# Patient Record
Sex: Female | Born: 1985 | Race: White | Hispanic: No | Marital: Married | Smoking: Current every day smoker
Health system: Southern US, Community
[De-identification: ages and names within clinical notes are randomized; demographics above are authoritative.]

## PROBLEM LIST (undated history)

## (undated) DIAGNOSIS — F319 Bipolar disorder, unspecified: Secondary | ICD-10-CM

## (undated) DIAGNOSIS — F1910 Other psychoactive substance abuse, uncomplicated: Secondary | ICD-10-CM

## (undated) DIAGNOSIS — Z87440 Personal history of urinary (tract) infections: Secondary | ICD-10-CM

## (undated) DIAGNOSIS — F32A Depression, unspecified: Secondary | ICD-10-CM

## (undated) DIAGNOSIS — D649 Anemia, unspecified: Secondary | ICD-10-CM

## (undated) DIAGNOSIS — F329 Major depressive disorder, single episode, unspecified: Secondary | ICD-10-CM

## (undated) DIAGNOSIS — F53 Postpartum depression: Secondary | ICD-10-CM

## (undated) DIAGNOSIS — Z87898 Personal history of other specified conditions: Secondary | ICD-10-CM

## (undated) DIAGNOSIS — Z8751 Personal history of pre-term labor: Secondary | ICD-10-CM

## (undated) DIAGNOSIS — F17200 Nicotine dependence, unspecified, uncomplicated: Secondary | ICD-10-CM

## (undated) DIAGNOSIS — N809 Endometriosis, unspecified: Secondary | ICD-10-CM

## (undated) DIAGNOSIS — Z8759 Personal history of other complications of pregnancy, childbirth and the puerperium: Secondary | ICD-10-CM

## (undated) DIAGNOSIS — N83209 Unspecified ovarian cyst, unspecified side: Secondary | ICD-10-CM

## (undated) DIAGNOSIS — O99345 Other mental disorders complicating the puerperium: Secondary | ICD-10-CM

## (undated) DIAGNOSIS — R011 Cardiac murmur, unspecified: Secondary | ICD-10-CM

## (undated) HISTORY — DX: Personal history of pre-term labor: Z87.51

## (undated) HISTORY — DX: Personal history of urinary (tract) infections: Z87.440

## (undated) HISTORY — DX: Endometriosis, unspecified: N80.9

## (undated) HISTORY — PX: NO PAST SURGERIES: SHX2092

## (undated) HISTORY — DX: Personal history of other specified conditions: Z87.898

## (undated) HISTORY — DX: Bipolar disorder, unspecified: F31.9

## (undated) HISTORY — DX: Postpartum depression: F53.0

## (undated) HISTORY — DX: Other psychoactive substance abuse, uncomplicated: F19.10

## (undated) HISTORY — DX: Personal history of other complications of pregnancy, childbirth and the puerperium: Z87.59

## (undated) HISTORY — DX: Anemia, unspecified: D64.9

## (undated) HISTORY — PX: OTHER SURGICAL HISTORY: SHX169

## (undated) HISTORY — DX: Other mental disorders complicating the puerperium: O99.345

---
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
## 2007-09-02 ENCOUNTER — Other Ambulatory Visit: Admission: RE | Admit: 2007-09-02 | Discharge: 2007-09-02 | Payer: Self-pay | Admitting: Obstetrics & Gynecology

## 2007-10-06 ENCOUNTER — Emergency Department: Payer: Self-pay | Admitting: Emergency Medicine

## 2008-02-11 ENCOUNTER — Observation Stay (HOSPITAL_COMMUNITY): Admission: AD | Admit: 2008-02-11 | Discharge: 2008-02-11 | Payer: Self-pay | Admitting: Family Medicine

## 2008-02-11 ENCOUNTER — Ambulatory Visit: Payer: Self-pay | Admitting: Family Medicine

## 2008-02-25 ENCOUNTER — Ambulatory Visit: Payer: Self-pay | Admitting: Family Medicine

## 2008-02-25 ENCOUNTER — Inpatient Hospital Stay (HOSPITAL_COMMUNITY): Admission: AD | Admit: 2008-02-25 | Discharge: 2008-02-25 | Payer: Self-pay | Admitting: Family Medicine

## 2008-02-25 ENCOUNTER — Inpatient Hospital Stay (HOSPITAL_COMMUNITY): Admission: AD | Admit: 2008-02-25 | Discharge: 2008-02-28 | Payer: Self-pay | Admitting: Obstetrics & Gynecology

## 2010-12-15 LAB — CBC
HCT: 32.1 % — ABNORMAL LOW (ref 36.0–46.0)
HCT: 32.2 % — ABNORMAL LOW (ref 36.0–46.0)
Hemoglobin: 11.7 g/dL — ABNORMAL LOW (ref 12.0–15.0)
MCHC: 33.7 g/dL (ref 30.0–36.0)
MCHC: 34.2 g/dL (ref 30.0–36.0)
MCV: 84.6 fL (ref 78.0–100.0)
MCV: 84.7 fL (ref 78.0–100.0)
MCV: 85.7 fL (ref 78.0–100.0)
Platelets: 189 10*3/uL (ref 150–400)
Platelets: 207 10*3/uL (ref 150–400)
RBC: 4.12 MIL/uL (ref 3.87–5.11)
RDW: 13.3 % (ref 11.5–15.5)
RDW: 13.5 % (ref 11.5–15.5)
RDW: 14.2 % (ref 11.5–15.5)
WBC: 15.1 10*3/uL — ABNORMAL HIGH (ref 4.0–10.5)

## 2010-12-15 LAB — STREP B DNA PROBE: Strep Group B Ag: NEGATIVE

## 2010-12-15 LAB — RPR: RPR Ser Ql: NONREACTIVE

## 2011-10-02 ENCOUNTER — Encounter (HOSPITAL_COMMUNITY): Payer: Self-pay | Admitting: *Deleted

## 2011-10-02 ENCOUNTER — Emergency Department (HOSPITAL_COMMUNITY)
Admission: EM | Admit: 2011-10-02 | Discharge: 2011-10-03 | Disposition: A | Discharge status: Home / Self Care | Payer: Self-pay | Attending: Emergency Medicine | Admitting: Emergency Medicine

## 2011-10-02 DIAGNOSIS — R059 Cough, unspecified: Secondary | ICD-10-CM | POA: Insufficient documentation

## 2011-10-02 DIAGNOSIS — M778 Other enthesopathies, not elsewhere classified: Secondary | ICD-10-CM

## 2011-10-02 DIAGNOSIS — M65839 Other synovitis and tenosynovitis, unspecified forearm: Secondary | ICD-10-CM | POA: Insufficient documentation

## 2011-10-02 DIAGNOSIS — R05 Cough: Secondary | ICD-10-CM | POA: Insufficient documentation

## 2011-10-02 DIAGNOSIS — M25539 Pain in unspecified wrist: Secondary | ICD-10-CM | POA: Insufficient documentation

## 2011-10-02 NOTE — ED Notes (Signed)
Wrist pain for over a week, no known injury

## 2011-10-03 MED ORDER — MELOXICAM 7.5 MG PO TABS: ORAL_TABLET | ORAL | Status: DC

## 2011-10-03 MED ORDER — KETOROLAC TROMETHAMINE 10 MG PO TABS
10.0000 mg | ORAL_TABLET | Freq: Once | ORAL | Status: AC
  Administered 2011-10-03: 10 mg via ORAL
  Filled 2011-10-03: qty 1

## 2011-10-03 MED ORDER — HYDROCODONE-ACETAMINOPHEN 5-325 MG PO TABS: 1.0000 | ORAL_TABLET | ORAL | Status: AC | PRN

## 2011-10-03 MED ORDER — HYDROCODONE-ACETAMINOPHEN 5-325 MG PO TABS: 1.0000 | ORAL_TABLET | ORAL | Status: DC | PRN

## 2011-10-03 NOTE — ED Provider Notes (Signed)
Medical screening examination/treatment/procedure(s) were performed by non-physician practitioner and as supervising physician I was immediately available for consultation/collaboration.    Vida Roller, MD 10/03/11 [phone]

## 2011-10-03 NOTE — ED Provider Notes (Signed)
History     CSN: [id number]  Arrival date & time 10/02/11  2209   First MD Initiated Contact with Patient 10/02/11 2350      Chief Complaint  Patient presents with  . Wrist Pain    (Consider location/radiation/quality/duration/timing/severity/associated sxs/prior treatment) HPI Comments: Patient states she has had approximately 2-3 weeks of pain in the left wrist. Over the last 2-3 days the pain has been moving from the wrist up to the left shoulder. The pain is worse with movement. Patient has not had any known injury. The patient does a lot of lifting at her job. She also does a lot of lifting at work with her hands at home when cleaning up. The patient has not had any previous surgery or procedures on the left arm. She presents today because she heard a" pop in the wrist area and wanted it evaluated.  Patient is a 26 y.o. female presenting with wrist pain. The history is provided by the patient.  Wrist Pain Associated symptoms include arthralgias and coughing.    History reviewed. No pertinent past medical history.  No past surgical history on file.  No family history on file.  History  Substance Use Topics  . Smoking status: Current Everyday Smoker -- 1.0 packs/day  . Smokeless tobacco: Not on file  . Alcohol Use: No    OB History    Grav Para Term Preterm Abortions TAB SAB Ect Mult Living                  Review of Systems  Respiratory: Positive for cough.   Musculoskeletal: Positive for arthralgias.    Allergies  Benadryl; Clindamycin/lincomycin; and Codeine  Home Medications  No current outpatient prescriptions on file.  BP 123/63  Pulse 95  Temp 98.2 F (36.8 C) (Oral)  Resp 18  Ht 4\' 11"  (1.499 m)  Wt 94 lb 1 oz (42.666 kg)  BMI 19.00 kg/m2  SpO2 100%  Physical Exam  Nursing note and vitals reviewed. Constitutional: She is oriented to person, place, and time. She appears well-developed and well-nourished.  Non-toxic appearance.  HENT:  Head:  Normocephalic.  Right Ear: Tympanic membrane and external ear normal.  Left Ear: Tympanic membrane and external ear normal.  Eyes: EOM and lids are normal. Pupils are equal, round, and reactive to light.  Neck: Normal range of motion. Neck supple. Carotid bruit is not present.  Cardiovascular: Normal rate, regular rhythm, normal heart sounds, intact distal pulses and normal pulses.   Pulmonary/Chest: Breath sounds normal. No respiratory distress.  Abdominal: Soft. Bowel sounds are normal. There is no tenderness. There is no guarding.  Musculoskeletal: Normal range of motion.       There is soreness to pronation and supination of the left wrist with extension of the pain up to the elbow area. There no hot joints or hot areas on left. There is good capillary refill. Radial pulses and brachial pulses are 2+. This will range of motion of the left shoulder.  Lymphadenopathy:       Head (right side): No submandibular adenopathy present.       Head (left side): No submandibular adenopathy present.    She has no cervical adenopathy.  Neurological: She is alert and oriented to person, place, and time. She has normal strength. No cranial nerve deficit or sensory deficit.  Skin: Skin is warm and dry.  Psychiatric: She has a normal mood and affect. Her speech is normal.    ED Course  Procedures (  including critical care time)  Labs Reviewed - No data to display No results found.   No diagnosis found.    MDM   Patient noted on examination to have a tendinitis involving the left wrist. The patient was fitted with a left wrist splint.  Mobic 7.5 mg and Norco 5 mg ordered.       Kathie Dike, [place] 10/03/11 [phone]

## 2012-03-12 DIAGNOSIS — F17200 Nicotine dependence, unspecified, uncomplicated: Secondary | ICD-10-CM

## 2012-03-12 HISTORY — DX: Nicotine dependence, unspecified, uncomplicated: F17.200

## 2012-07-13 ENCOUNTER — Emergency Department (HOSPITAL_COMMUNITY)
Admission: EM | Admit: 2012-07-13 | Discharge: 2012-07-13 | Disposition: A | Discharge status: Home / Self Care | Payer: Medicaid Other | Attending: Emergency Medicine | Admitting: Emergency Medicine

## 2012-07-13 ENCOUNTER — Encounter (HOSPITAL_COMMUNITY): Payer: Self-pay | Admitting: Emergency Medicine

## 2012-07-13 ENCOUNTER — Emergency Department (HOSPITAL_COMMUNITY): Payer: Medicaid Other

## 2012-07-13 DIAGNOSIS — S8012XA Contusion of left lower leg, initial encounter: Secondary | ICD-10-CM

## 2012-07-13 DIAGNOSIS — F17200 Nicotine dependence, unspecified, uncomplicated: Secondary | ICD-10-CM | POA: Insufficient documentation

## 2012-07-13 DIAGNOSIS — S91309A Unspecified open wound, unspecified foot, initial encounter: Secondary | ICD-10-CM | POA: Insufficient documentation

## 2012-07-13 DIAGNOSIS — S8010XA Contusion of unspecified lower leg, initial encounter: Secondary | ICD-10-CM | POA: Insufficient documentation

## 2012-07-13 DIAGNOSIS — Y929 Unspecified place or not applicable: Secondary | ICD-10-CM | POA: Insufficient documentation

## 2012-07-13 DIAGNOSIS — Y939 Activity, unspecified: Secondary | ICD-10-CM | POA: Insufficient documentation

## 2012-07-13 DIAGNOSIS — W010XXA Fall on same level from slipping, tripping and stumbling without subsequent striking against object, initial encounter: Secondary | ICD-10-CM | POA: Insufficient documentation

## 2012-07-13 DIAGNOSIS — R011 Cardiac murmur, unspecified: Secondary | ICD-10-CM | POA: Insufficient documentation

## 2012-07-13 DIAGNOSIS — S91332A Puncture wound without foreign body, left foot, initial encounter: Secondary | ICD-10-CM

## 2012-07-13 HISTORY — DX: Cardiac murmur, unspecified: R01.1

## 2012-07-13 MED ORDER — HYDROCODONE-ACETAMINOPHEN 5-325 MG PO TABS
1.0000 | ORAL_TABLET | Freq: Once | ORAL | Status: AC
  Administered 2012-07-13: 1 via ORAL
  Filled 2012-07-13: qty 1

## 2012-07-13 MED ORDER — HYDROCODONE-ACETAMINOPHEN 5-325 MG PO TABS: ORAL_TABLET | ORAL | Status: DC

## 2012-07-13 MED ORDER — CIPROFLOXACIN HCL 500 MG PO TABS: 500.0000 mg | ORAL_TABLET | Freq: Two times a day (BID) | ORAL | Status: DC
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
## 2012-07-13 NOTE — ED Notes (Signed)
Discharge instructions given and reviewed with patient.  Prescriptions given; effects and use explained.  Patient verbalized understanding to complete all antibiotic and sedating effects of Vicodin.  Crutch fitting and teaching completed with return demonstration.  Patient discharged home in good condition.
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
## 2012-07-19 ENCOUNTER — Emergency Department (HOSPITAL_COMMUNITY)
Admission: EM | Admit: 2012-07-19 | Discharge: 2012-07-19 | Disposition: A | Discharge status: Home / Self Care | Payer: Medicaid Other | Attending: Emergency Medicine | Admitting: Emergency Medicine

## 2012-07-19 ENCOUNTER — Encounter (HOSPITAL_COMMUNITY): Payer: Self-pay | Admitting: *Deleted

## 2012-07-19 DIAGNOSIS — S93409A Sprain of unspecified ligament of unspecified ankle, initial encounter: Secondary | ICD-10-CM | POA: Insufficient documentation

## 2012-07-19 DIAGNOSIS — F17200 Nicotine dependence, unspecified, uncomplicated: Secondary | ICD-10-CM | POA: Insufficient documentation

## 2012-07-19 DIAGNOSIS — R011 Cardiac murmur, unspecified: Secondary | ICD-10-CM | POA: Insufficient documentation

## 2012-07-19 DIAGNOSIS — Y929 Unspecified place or not applicable: Secondary | ICD-10-CM | POA: Insufficient documentation

## 2012-07-19 DIAGNOSIS — R296 Repeated falls: Secondary | ICD-10-CM | POA: Insufficient documentation

## 2012-07-19 DIAGNOSIS — Y939 Activity, unspecified: Secondary | ICD-10-CM | POA: Insufficient documentation

## 2012-07-19 MED ORDER — HYDROCODONE-ACETAMINOPHEN 7.5-325 MG PO TABS: 1.0000 | ORAL_TABLET | ORAL | Status: DC | PRN

## 2012-07-19 MED ORDER — IBUPROFEN 800 MG PO TABS: 800.0000 mg | ORAL_TABLET | Freq: Three times a day (TID) | ORAL | Status: DC
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
## 2012-07-19 NOTE — ED Notes (Signed)
Pt states she was seen here a few days ago due to a foot injury. Pain to left foot continues. Vicodin is not helping.

## 2012-07-20 NOTE — ED Provider Notes (Signed)
Medical screening examination/treatment/procedure(s) were performed by non-physician practitioner and as supervising physician I was immediately available for consultation/collaboration.  Adella Manolis, MD 07/20/12 1531 

## 2012-07-23 NOTE — ED Provider Notes (Signed)
Medical screening examination/treatment/procedure(s) were performed by non-physician practitioner and as supervising physician I was immediately available for consultation/collaboration. Devoria Albe, MD, Armando Gang   Ward Givens, MD 07/23/12 [phone]

## 2012-09-16 ENCOUNTER — Emergency Department: Payer: Self-pay | Admitting: Emergency Medicine

## 2012-10-10 DIAGNOSIS — N83209 Unspecified ovarian cyst, unspecified side: Secondary | ICD-10-CM

## 2012-10-10 HISTORY — DX: Unspecified ovarian cyst, unspecified side: N83.209

## 2012-10-28 ENCOUNTER — Emergency Department (HOSPITAL_COMMUNITY): Payer: Medicaid Other

## 2012-10-28 ENCOUNTER — Emergency Department (HOSPITAL_COMMUNITY)
Admission: EM | Admit: 2012-10-28 | Discharge: 2012-10-28 | Disposition: A | Discharge status: Home / Self Care | Payer: Medicaid Other | Attending: Emergency Medicine | Admitting: Emergency Medicine

## 2012-10-28 ENCOUNTER — Encounter (HOSPITAL_COMMUNITY): Payer: Self-pay | Admitting: *Deleted

## 2012-10-28 DIAGNOSIS — F17200 Nicotine dependence, unspecified, uncomplicated: Secondary | ICD-10-CM | POA: Insufficient documentation

## 2012-10-28 DIAGNOSIS — Z3202 Encounter for pregnancy test, result negative: Secondary | ICD-10-CM | POA: Insufficient documentation

## 2012-10-28 DIAGNOSIS — Z791 Long term (current) use of non-steroidal anti-inflammatories (NSAID): Secondary | ICD-10-CM | POA: Insufficient documentation

## 2012-10-28 DIAGNOSIS — R011 Cardiac murmur, unspecified: Secondary | ICD-10-CM | POA: Insufficient documentation

## 2012-10-28 DIAGNOSIS — N83209 Unspecified ovarian cyst, unspecified side: Secondary | ICD-10-CM

## 2012-10-28 DIAGNOSIS — N898 Other specified noninflammatory disorders of vagina: Secondary | ICD-10-CM | POA: Insufficient documentation

## 2012-10-28 HISTORY — DX: Nicotine dependence, unspecified, uncomplicated: F17.200

## 2012-10-28 HISTORY — DX: Unspecified ovarian cyst, unspecified side: N83.209

## 2012-10-28 LAB — BASIC METABOLIC PANEL
BUN: 10 mg/dL (ref 6–23)
Calcium: 9.3 mg/dL (ref 8.4–10.5)
GFR calc Af Amer: >90 mL/min (ref >90)
GFR calc non Af Amer: >90 mL/min (ref >90)
Glucose, Bld: 93 mg/dL (ref 70–99)
Sodium: 137 mEq/L (ref 135–145)

## 2012-10-28 LAB — URINALYSIS, ROUTINE W REFLEX MICROSCOPIC
Bilirubin Urine: NEGATIVE
Ketones, ur: NEGATIVE mg/dL
Leukocytes, UA: NEGATIVE
Nitrite: NEGATIVE
Protein, ur: NEGATIVE mg/dL
Urobilinogen, UA: 0.2 mg/dL (ref 0.0–1.0)
pH: 8 (ref 5.0–8.0)

## 2012-10-28 LAB — CBC WITH DIFFERENTIAL/PLATELET
Basophils Relative: 0 % (ref 0–1)
Eosinophils Absolute: 0.4 10*3/uL (ref 0.0–0.7)
Eosinophils Relative: 4 % (ref 0–5)
Lymphs Abs: 2.9 10*3/uL (ref 0.7–4.0)
MCH: 29.5 pg (ref 26.0–34.0)
MCHC: 33.8 g/dL (ref 30.0–36.0)
MCV: 87.5 fL (ref 78.0–100.0)
Neutrophils Relative %: 59 % (ref 43–77)
Platelets: 192 10*3/uL (ref 150–400)
RBC: 4.57 MIL/uL (ref 3.87–5.11)

## 2012-10-28 LAB — WET PREP, GENITAL: Yeast Wet Prep HPF POC: NONE SEEN

## 2012-10-28 MED ORDER — IOHEXOL 300 MG/ML  SOLN
50.0000 mL | Freq: Once | INTRAMUSCULAR | Status: AC | PRN
  Administered 2012-10-28: 50 mL via ORAL

## 2012-10-28 MED ORDER — ONDANSETRON 4 MG PO TBDP: ORAL_TABLET | ORAL | Status: DC

## 2012-10-28 MED ORDER — HYDROMORPHONE HCL PF 1 MG/ML IJ SOLN
0.5000 mg | Freq: Once | INTRAMUSCULAR | Status: AC
  Administered 2012-10-28: 0.5 mg via INTRAVENOUS
  Filled 2012-10-28: qty 1

## 2012-10-28 MED ORDER — IOHEXOL 300 MG/ML  SOLN
100.0000 mL | Freq: Once | INTRAMUSCULAR | Status: AC | PRN
  Administered 2012-10-28: 100 mL via INTRAVENOUS

## 2012-10-28 MED ORDER — SODIUM CHLORIDE 0.9 % IV BOLUS (SEPSIS)
1000.0000 mL | INTRAVENOUS | Status: AC
  Administered 2012-10-28: 1000 mL via INTRAVENOUS

## 2012-10-28 MED ORDER — OXYCODONE-ACETAMINOPHEN 5-325 MG PO TABS: 1.0000 | ORAL_TABLET | Freq: Four times a day (QID) | ORAL | Status: DC | PRN

## 2012-10-28 NOTE — Progress Notes (Signed)
ED/CM noted patient did not have health insurance and/or PCP listed in the computer.  Patient was given the [place] resource handout with information on the clinics, food pantries, and the handout for new health insurance sign-up.  Patient expressed appreciation for this. 

## 2012-10-28 NOTE — ED Notes (Signed)
Patient given discharge instruction, verbalized understand. IV removed, band aid applied. Patient ambulatory out of the department with family 

## 2012-10-28 NOTE — ED Notes (Signed)
Pain rt flank for 2 days,dysuria,No fever or chills, no n/v

## 2012-10-28 NOTE — ED Notes (Signed)
Rt sided flank/pelvic pain with increasing pain when voiding x 2 days. Denies N/V/D, fever, hematuria and urinary frequency. Pt reports yellow vaginal discharge without odor.  NAD noted

## 2012-10-28 NOTE — ED Notes (Signed)
Pt ambulated to room without difficulty, pt drinking a code red at this time. Advised not to drink more until seen by DR.

## 2012-10-28 NOTE — ED Provider Notes (Signed)
CSN: [id number]     Arrival date & time 10/28/12  1222 History  This chart was scribed for Junius Argyle, MD by Bennett Scrape, ED Scribe. This patient was seen in room APA07/APA07 and the patient's care was started at 3:39 PM.   Chief Complaint  Patient presents with  . Abdominal Pain    Patient is a 27 y.o. female presenting with abdominal pain. The history is provided by the patient. No language interpreter was used.  Abdominal Pain Pain location:  RLQ Pain quality: sharp   Pain radiates to:  R flank Onset quality:  Sudden Duration:  2 days Timing:  Constant Progression:  Worsening Chronicity:  New Associated symptoms: vaginal discharge   Associated symptoms: no cough, no diarrhea, no dysuria, no fever, no nausea and no vomiting     HPI Comments: Laura Vazquez is a 27 y.o. female who presents to the Emergency Department complaining of gradual onset, gradually worsening RLQ abdominal pain that radiates to the right lower back that started 2 days ago. She is unsure of the activity that she was doing at the time. The pain is described as a constant sharp pain that is aggravated by urination. She reports recent associated vaginal discharge as well. She has tried tylenol with no relief and rates the pain a 6 out of 10 currently. She states that she has been eating and drinking normally since the onset. Pt has hx of ovarian cysts and reports similar sx in the past. She reports suprapubic cramping associated with her menses. She states that she is sexually active, last sexual interaction was 2 weeks ago. She denies having a h/o STDs.  LMP was 10/14/12. She denies dysuria, fevers, cough and diarrhea as associated symptoms. She deneis having any prior abdominal surgeries. Pt is a current everyday smoker but denies illegal drug or alcohol use.    Past Medical History  Diagnosis Date  . Heart murmur    History reviewed. No pertinent past surgical history. History reviewed. No pertinent  family history. History  Substance Use Topics  . Smoking status: Current Every Day Smoker -- 1.00 packs/day  . Smokeless tobacco: Not on file  . Alcohol Use: No   No OB history provided.  Review of Systems  Constitutional: Negative for fever.  Respiratory: Negative for cough.   Gastrointestinal: Positive for abdominal pain. Negative for nausea, vomiting and diarrhea.  Genitourinary: Positive for vaginal discharge. Negative for dysuria.  All other systems reviewed and are negative.    Allergies  Benadryl (as an infant); Clindamycin/lincomycin (hives); and Codeine (as an infant)-confirmed by pt at bedside  Home Medications   Current Outpatient Rx  Name  Route  Sig  Dispense  Refill  . ciprofloxacin (CIPRO) 500 MG tablet   Oral   Take 1 tablet (500 mg total) by mouth 2 (two) times daily. For 10 days   20 tablet   0   . HYDROcodone-acetaminophen (NORCO) 7.5-325 MG per tablet   Oral   Take 1 tablet by mouth every 4 (four) hours as needed for pain.   20 tablet   0   . HYDROcodone-acetaminophen (NORCO/VICODIN) 5-325 MG per tablet   Oral   Take 1-2 tablets by mouth every 4 (four) hours as needed for pain. Take one-two tabs po q 4-6 hrs prn pain         . ibuprofen (ADVIL,MOTRIN) 200 MG tablet   Oral   Take 200 mg by mouth every 6 (six) hours as needed for pain.         Marland Kitchen  ibuprofen (ADVIL,MOTRIN) 800 MG tablet   Oral   Take 1 tablet (800 mg total) by mouth 3 (three) times daily. Take with food   21 tablet   0    Triage Vitals: BP 122/62  Pulse 80  Temp(Src) 97.3 F (36.3 C) (Oral)  Resp 12  Ht 4\' 11"  (1.499 m)  Wt 100 lb (45.36 kg)  BMI 20.19 kg/m2  SpO2 100%  LMP 10/14/2012  Physical Exam  Nursing note and vitals reviewed. Constitutional: She is oriented to person, place, and time. She appears well-developed and well-nourished. No distress.  HENT:  Head: Normocephalic and atraumatic.  Eyes: EOM are normal.  Neck: Neck supple. No tracheal deviation  present.  Cardiovascular: Normal rate and regular rhythm.   Pulmonary/Chest: Effort normal and breath sounds normal. No respiratory distress.  Abdominal: Soft. There is tenderness (mild to moderate tenderness to palpation in the RLQ). There is no rebound and no guarding.  Genitourinary: Vagina normal.  Normal appearing external vagina. Normal appearing cervix. Os closed. No CMT. Mild ttp of uterine area and right adnexa on bimanual.   Musculoskeletal: Normal range of motion.  Neurological: She is alert and oriented to person, place, and time.  Skin: Skin is warm and dry.  Psychiatric: She has a normal mood and affect. Her behavior is normal.    ED Course   DIAGNOSTIC STUDIES: Oxygen Saturation is 100% on room air, normal by my interpretation.    COORDINATION OF CARE: 3:44 PM-Discussed treatment plan which includes CT of abdomen, pelvic exam, CBC panel, BMP and UA with pt at bedside and pt agreed to plan.   Procedures (including critical care time)  Labs Reviewed  WET PREP, GENITAL - Abnormal; Notable for the following:    WBC, Wet Prep HPF POC FEW (*)    All other components within normal limits  GC/CHLAMYDIA PROBE AMP  URINALYSIS, ROUTINE W REFLEX MICROSCOPIC  PREGNANCY, URINE  CBC WITH DIFFERENTIAL  BASIC METABOLIC PANEL   Ct Abdomen Pelvis W Contrast  10/28/2012   *RADIOLOGY REPORT*  Clinical Data: Right lower quadrant and right-sided low back pain. Painful urination.  CT ABDOMEN AND PELVIS WITH CONTRAST  Technique:  Multidetector CT imaging of the abdomen and pelvis was performed following the standard protocol during bolus administration of intravenous contrast.  Contrast: 50mL OMNIPAQUE IOHEXOL 300 MG/ML  SOLN, OMNIPAQUE IOHEXOL 300 MG/ML  SOLN  Comparison: None.  Findings: The patient has prominent periportal edema in the liver. This appearance is nonspecific but can be associated with hepatitis.  Heart size is normal.  Biliary tree is normal.  Spleen, pancreas, adrenal  glands, and kidneys are normal.  The bowel is normal including the terminal ileum and appendix.  There is a 5 cm hemorrhagic cyst in the right ovary.  Left ovary and uterus are normal.  Tiny amount of free fluid in the pelvic cul- de-sac.  No osseous abnormality.  IMPRESSION:  1.  Periportal edema, nonspecific but suggestive of hepatitis. 2.  Hemorrhagic cyst in the right ovary.   Original Report Authenticated By: Francene Boyers, M.D.   1. Hemorrhagic cyst of ovary     MDM  4:36 PM 27 y.o. female here w/ RLQ pain x 2 days. Gradual onset. Does not sound cw torsion. Concern for appy. Will get CT abd, pain control, labs.    7:00 PM: CT showing hemorrhagic cyst on right ovary and periportal edema which is non-specific but could be assoc w/ hepatitis. I suspect her sx to be assoc  w/ the hem cyst. I do not think the incidental periportal edema finding is related to her pain today. I recommended she f/u at health dept or pcp for hepatitis screen and workup. I have discussed the diagnosis/risks/treatment options with the patient and believe the pt to be eligible for discharge home to follow-up with pcp/health dept. We also discussed returning to the ED immediately if new or worsening sx occur. We discussed the sx which are most concerning (e.g., worsening pain, fever, inability to tolerate po) that necessitate immediate return. Any new prescriptions provided to the patient are listed below.  New Prescriptions   No medications on file     I personally performed the services described in this documentation, which was scribed in my presence. The recorded information has been reviewed and is accurate.     Junius Argyle, MD 10/29/12 1146

## 2012-10-29 LAB — GC/CHLAMYDIA PROBE AMP: GC Probe RNA: NEGATIVE

## 2012-11-02 ENCOUNTER — Emergency Department (HOSPITAL_COMMUNITY)
Admission: EM | Admit: 2012-11-02 | Discharge: 2012-11-03 | Disposition: A | Discharge status: Home / Self Care | Payer: Medicaid Other | Attending: Emergency Medicine | Admitting: Emergency Medicine

## 2012-11-02 ENCOUNTER — Encounter (HOSPITAL_COMMUNITY): Payer: Self-pay | Admitting: *Deleted

## 2012-11-02 DIAGNOSIS — N898 Other specified noninflammatory disorders of vagina: Secondary | ICD-10-CM | POA: Insufficient documentation

## 2012-11-02 DIAGNOSIS — R300 Dysuria: Secondary | ICD-10-CM | POA: Insufficient documentation

## 2012-11-02 DIAGNOSIS — R011 Cardiac murmur, unspecified: Secondary | ICD-10-CM | POA: Insufficient documentation

## 2012-11-02 DIAGNOSIS — N83209 Unspecified ovarian cyst, unspecified side: Secondary | ICD-10-CM | POA: Insufficient documentation

## 2012-11-02 DIAGNOSIS — F17200 Nicotine dependence, unspecified, uncomplicated: Secondary | ICD-10-CM | POA: Insufficient documentation

## 2012-11-02 DIAGNOSIS — N949 Unspecified condition associated with female genital organs and menstrual cycle: Secondary | ICD-10-CM | POA: Insufficient documentation

## 2012-11-02 DIAGNOSIS — R110 Nausea: Secondary | ICD-10-CM | POA: Insufficient documentation

## 2012-11-02 DIAGNOSIS — M549 Dorsalgia, unspecified: Secondary | ICD-10-CM | POA: Insufficient documentation

## 2012-11-02 DIAGNOSIS — R109 Unspecified abdominal pain: Secondary | ICD-10-CM | POA: Insufficient documentation

## 2012-11-02 DIAGNOSIS — IMO0002 Reserved for concepts with insufficient information to code with codable children: Secondary | ICD-10-CM | POA: Insufficient documentation

## 2012-11-02 DIAGNOSIS — R102 Pelvic and perineal pain: Secondary | ICD-10-CM

## 2012-11-02 LAB — COMPREHENSIVE METABOLIC PANEL
AST: 16 U/L (ref 0–37)
Albumin: 3.6 g/dL (ref 3.5–5.2)
Calcium: 9.1 mg/dL (ref 8.4–10.5)
Creatinine, Ser: 0.61 mg/dL (ref 0.50–1.10)
Total Protein: 6.9 g/dL (ref 6.0–8.3)

## 2012-11-02 LAB — URINALYSIS, ROUTINE W REFLEX MICROSCOPIC
Glucose, UA: NEGATIVE mg/dL
Hgb urine dipstick: NEGATIVE
Leukocytes, UA: NEGATIVE
Specific Gravity, Urine: >1.03 — ABNORMAL HIGH (ref 1.005–1.030)

## 2012-11-02 LAB — CBC WITH DIFFERENTIAL/PLATELET
Basophils Absolute: 0 10*3/uL (ref 0.0–0.1)
Basophils Relative: 0 % (ref 0–1)
Eosinophils Absolute: 0.4 10*3/uL (ref 0.0–0.7)
Eosinophils Relative: 5 % (ref 0–5)
HCT: 36.1 % (ref 36.0–46.0)
MCH: 29.5 pg (ref 26.0–34.0)
MCHC: 33.8 g/dL (ref 30.0–36.0)
MCV: 87.4 fL (ref 78.0–100.0)
Monocytes Absolute: 0.6 10*3/uL (ref 0.1–1.0)
Neutro Abs: 3.3 10*3/uL (ref 1.7–7.7)
RDW: 13.8 % (ref 11.5–15.5)

## 2012-11-02 LAB — LIPASE, BLOOD: Lipase: 17 U/L (ref 11–59)

## 2012-11-02 MED ORDER — KETOROLAC TROMETHAMINE 60 MG/2ML IM SOLN
30.0000 mg | Freq: Once | INTRAMUSCULAR | Status: AC
  Administered 2012-11-02: 30 mg via INTRAMUSCULAR
  Filled 2012-11-02: qty 2

## 2012-11-02 NOTE — ED Provider Notes (Signed)
CSN: [id number]     Arrival date & time 11/02/12  2138 History     First MD Initiated Contact with Patient 11/02/12 2154     Chief Complaint  Patient presents with  . Abdominal Pain  . Ovarian Cyst   (Consider location/radiation/quality/duration/timing/severity/associated sxs/prior Treatment) Patient is a 27 y.o. female presenting with abdominal pain. The history is provided by the patient.  Abdominal Pain Pain location:  RLQ and RUQ Pain quality: sharp   Pain radiates to:  R flank Onset quality:  Gradual Duration:  1 week Timing:  Constant Progression:  Worsening Chronicity:  New Context: recent sexual activity (2 days ago and was painful)   Relieved by:  Nothing Associated symptoms: dysuria, nausea and vaginal discharge   Associated symptoms: no chills, no fever and no vomiting    Laura Vazquez is a 27 y.o. female who presents to the ED with abdominal pain that started over a week ago. She was evaluated here 2 days ago and had a CT scan that shows a hemorrhagic ovarian cyst. She has been taking Percocet which helps for a while but then the pain returns.  LMP 10/14/2012, no birth control, last sexual intercourse 2 days ago, unprotected. G2 P2, current sex partner 6 years. Unsure of last pap smear, thinks 4 years ago when had her last child. Hx of ovarian cyst in the past but never hurt this bad. The pain tonight is also in the right upper quadrant. She has nausea but no vomiting.  Past Medical History  Diagnosis Date  . Heart murmur   . Other and unspecified ovarian cysts 8/14  . Smoker 2014   History reviewed. No pertinent past surgical history. History reviewed. No pertinent family history. History  Substance Use Topics  . Smoking status: Current Every Day Smoker -- 1.00 packs/day  . Smokeless tobacco: Not on file  . Alcohol Use: No   OB History   Grav Para Term Preterm Abortions TAB SAB Ect Mult Living                 Review of Systems  Constitutional: Negative  for fever and chills.  HENT: Negative for mouth sores.   Gastrointestinal: Positive for nausea and abdominal pain. Negative for vomiting.  Genitourinary: Positive for dysuria, vaginal discharge, pelvic pain and dyspareunia. Negative for urgency and frequency.  Musculoskeletal: Positive for back pain.  Skin: Negative for rash.  Neurological: Negative for syncope and headaches.  Psychiatric/Behavioral: The patient is not nervous/anxious.     Allergies  Benadryl; Clindamycin/lincomycin; and Codeine  Home Medications   Current Outpatient Rx  Name  Route  Sig  Dispense  Refill  . acetaminophen (TYLENOL) 500 MG tablet   Oral   Take 500-1,000 mg by mouth every 6 (six) hours as needed for pain.         Marland Kitchen ibuprofen (ADVIL,MOTRIN) 200 MG tablet   Oral   Take 800 mg by mouth every 6 (six) hours as needed for pain.          Marland Kitchen ondansetron (ZOFRAN ODT) 4 MG disintegrating tablet      4mg  ODT q4 hours prn nausea/vomit   4 tablet   0   . oxyCODONE-acetaminophen (PERCOCET) 5-325 MG per tablet   Oral   Take 1 tablet by mouth every 6 (six) hours as needed for pain.   15 tablet   0    BP 125/53  Pulse 73  Temp(Src) 98.3 F (36.8 C)  Resp 20  Ht 4'  11" (1.499 m)  Wt 100 lb (45.36 kg)  BMI 20.19 kg/m2  SpO2 100%  LMP 10/14/2012 Physical Exam  Nursing note and vitals reviewed. Constitutional: She is oriented to person, place, and time. She appears well-developed and well-nourished. No distress.  HENT:  Head: Normocephalic and atraumatic.  Eyes: Conjunctivae and EOM are normal.  Neck: Neck supple.  Cardiovascular: Normal rate and regular rhythm.   Pulmonary/Chest: Effort normal and breath sounds normal.  Abdominal: Soft. Bowel sounds are normal. There is tenderness in the right upper quadrant and right lower quadrant. There is no rigidity, no rebound, no guarding and no CVA tenderness.  Musculoskeletal: Normal range of motion.  Neurological: She is alert and oriented to person,  place, and time. No cranial nerve deficit.  Skin: Skin is warm and dry.  Psychiatric: She has a normal mood and affect. Her behavior is normal.   Results for orders placed during the hospital encounter of 11/02/12 (from the past 24 hour(s))  URINALYSIS, ROUTINE W REFLEX MICROSCOPIC     Status: Abnormal   Collection Time    11/02/12 10:16 PM      Result Value Range   Color, Urine YELLOW  YELLOW   APPearance CLEAR  CLEAR   Specific Gravity, Urine >1.030 (*) 1.005 - 1.030   pH 6.0  5.0 - 8.0   Glucose, UA NEGATIVE  NEGATIVE mg/dL   Hgb urine dipstick NEGATIVE  NEGATIVE   Bilirubin Urine NEGATIVE  NEGATIVE   Ketones, ur NEGATIVE  NEGATIVE mg/dL   Protein, ur NEGATIVE  NEGATIVE mg/dL   Urobilinogen, UA 0.2  0.0 - 1.0 mg/dL   Nitrite NEGATIVE  NEGATIVE   Leukocytes, UA NEGATIVE  NEGATIVE  CBC WITH DIFFERENTIAL     Status: Abnormal   Collection Time    11/02/12 10:40 PM      Result Value Range   WBC 8.1  4.0 - 10.5 K/uL   RBC 4.13  3.87 - 5.11 MIL/uL   Hemoglobin 12.2  12.0 - 15.0 g/dL   HCT 21.3  08.6 - 57.8 %   MCV 87.4  78.0 - 100.0 fL   MCH 29.5  26.0 - 34.0 pg   MCHC 33.8  30.0 - 36.0 g/dL   RDW 46.9  62.9 - 52.8 %   Platelets 209  150 - 400 K/uL   Neutrophils Relative % 40 (*) 43 - 77 %   Neutro Abs 3.3  1.7 - 7.7 K/uL   Lymphocytes Relative 47 (*) 12 - 46 %   Lymphs Abs 3.8  0.7 - 4.0 K/uL   Monocytes Relative 8  3 - 12 %   Monocytes Absolute 0.6  0.1 - 1.0 K/uL   Eosinophils Relative 5  0 - 5 %   Eosinophils Absolute 0.4  0.0 - 0.7 K/uL   Basophils Relative 0  0 - 1 %   Basophils Absolute 0.0  0.0 - 0.1 K/uL  COMPREHENSIVE METABOLIC PANEL     Status: Abnormal   Collection Time    11/02/12 10:40 PM      Result Value Range   Sodium 140  135 - 145 mEq/L   Potassium 3.4 (*) 3.5 - 5.1 mEq/L   Chloride 105  96 - 112 mEq/L   CO2 27  19 - 32 mEq/L   Glucose, Bld 99  70 - 99 mg/dL   BUN 11  6 - 23 mg/dL   Creatinine, Ser 4.13  0.50 - 1.10 mg/dL   Calcium 9.1  8.4 -  10.5  mg/dL   Total Protein 6.9  6.0 - 8.3 g/dL   Albumin 3.6  3.5 - 5.2 g/dL   AST 16  0 - 37 U/L   ALT 12  0 - 35 U/L   Alkaline Phosphatase 48  39 - 117 U/L   Total Bilirubin 0.1 (*) 0.3 - 1.2 mg/dL   GFR calc non Af Amer >90  >90 mL/min   GFR calc Af Amer >90  >90 mL/min  LIPASE, BLOOD     Status: None   Collection Time    11/02/12 10:40 PM      Result Value Range   Lipase 17  11 - 59 U/L   ED Course  Scheduled for ultrasound tomorrow. 1 pm  Procedures  MDM  27 y.o. female with pelvic pain and lower abdominal pain. Labs tonight are normal. Pain improved with Toradol 30 mg IM. Patient stable for discharge home to follow up tomorrow for Ultrasound. She will continue her home medications as needed for pain.  I have reviewed this patient's vital signs, nurses notes, appropriate labs and imaging.  I have discussed findings with the patient and plan of care. She voices understanding.    [facility] Orlene Och, NP 11/03/12 0001

## 2012-11-02 NOTE — ED Notes (Signed)
Pt states she was seen here on Tuesday and was diagnosed with an ovarian cyst. Pt still c/o rlq pain and states she feels like that area has a "fever"

## 2012-11-02 NOTE — ED Notes (Signed)
Pt seen & treated here this week. Pt states pain worse & can not see PCP until next week.

## 2012-11-03 ENCOUNTER — Ambulatory Visit (HOSPITAL_COMMUNITY): Payer: Medicaid Other

## 2012-11-03 NOTE — ED Notes (Signed)
Pt alert & oriented x4, stable gait. Patient given discharge instructions, paperwork & prescription(s). Patient  instructed to stop at the registration desk to finish any additional paperwork. Patient verbalized understanding. Pt left department w/ no further questions. 

## 2012-11-03 NOTE — ED Provider Notes (Signed)
Medical screening examination/treatment/procedure(s) were performed by non-physician practitioner and as supervising physician I was immediately available for consultation/collaboration.  Tatiana Courter R. Brendt Dible, MD 11/03/12 0035 

## 2012-11-05 ENCOUNTER — Ambulatory Visit (HOSPITAL_COMMUNITY): Admission: RE | Admit: 2012-11-05 | Payer: Self-pay | Source: Ambulatory Visit

## 2012-11-06 ENCOUNTER — Other Ambulatory Visit (HOSPITAL_COMMUNITY): Payer: Self-pay | Admitting: Emergency Medicine

## 2012-11-06 DIAGNOSIS — R109 Unspecified abdominal pain: Secondary | ICD-10-CM

## 2012-11-07 ENCOUNTER — Other Ambulatory Visit (HOSPITAL_COMMUNITY): Payer: Medicaid Other

## 2012-11-07 ENCOUNTER — Ambulatory Visit (HOSPITAL_COMMUNITY): Payer: Self-pay

## 2012-11-07 ENCOUNTER — Other Ambulatory Visit (HOSPITAL_COMMUNITY): Payer: Self-pay | Admitting: Emergency Medicine

## 2012-11-07 ENCOUNTER — Ambulatory Visit (HOSPITAL_COMMUNITY)
Admission: RE | Admit: 2012-11-07 | Discharge: 2012-11-07 | Disposition: A | Discharge status: Home / Self Care | Payer: Self-pay | Source: Ambulatory Visit | Attending: Emergency Medicine | Admitting: Emergency Medicine

## 2012-11-07 DIAGNOSIS — R109 Unspecified abdominal pain: Secondary | ICD-10-CM

## 2012-11-08 ENCOUNTER — Other Ambulatory Visit (HOSPITAL_COMMUNITY): Payer: Self-pay

## 2012-11-08 ENCOUNTER — Ambulatory Visit (HOSPITAL_COMMUNITY)
Admission: RE | Admit: 2012-11-08 | Discharge: 2012-11-08 | Disposition: A | Discharge status: Home / Self Care | Payer: Medicaid Other | Source: Ambulatory Visit | Attending: Emergency Medicine | Admitting: Emergency Medicine

## 2012-11-08 DIAGNOSIS — R52 Pain, unspecified: Secondary | ICD-10-CM

## 2012-11-09 ENCOUNTER — Ambulatory Visit (HOSPITAL_COMMUNITY): Admission: RE | Admit: 2012-11-09 | Payer: Medicaid Other | Source: Ambulatory Visit

## 2013-09-14 ENCOUNTER — Encounter (HOSPITAL_COMMUNITY): Payer: Self-pay | Admitting: Emergency Medicine

## 2013-09-14 ENCOUNTER — Emergency Department (HOSPITAL_COMMUNITY)
Admission: EM | Admit: 2013-09-14 | Discharge: 2013-09-14 | Disposition: A | Discharge status: Home / Self Care | Payer: Medicaid Other | Attending: Emergency Medicine | Admitting: Emergency Medicine

## 2013-09-14 DIAGNOSIS — K0889 Other specified disorders of teeth and supporting structures: Secondary | ICD-10-CM

## 2013-09-14 DIAGNOSIS — R011 Cardiac murmur, unspecified: Secondary | ICD-10-CM | POA: Insufficient documentation

## 2013-09-14 DIAGNOSIS — F17200 Nicotine dependence, unspecified, uncomplicated: Secondary | ICD-10-CM | POA: Insufficient documentation

## 2013-09-14 DIAGNOSIS — Z791 Long term (current) use of non-steroidal anti-inflammatories (NSAID): Secondary | ICD-10-CM | POA: Insufficient documentation

## 2013-09-14 DIAGNOSIS — K089 Disorder of teeth and supporting structures, unspecified: Secondary | ICD-10-CM | POA: Insufficient documentation

## 2013-09-14 DIAGNOSIS — Z8742 Personal history of other diseases of the female genital tract: Secondary | ICD-10-CM | POA: Insufficient documentation

## 2013-09-14 MED ORDER — AMOXICILLIN 500 MG PO CAPS: 500.0000 mg | ORAL_CAPSULE | Freq: Three times a day (TID) | ORAL | Status: DC

## 2013-09-14 MED ORDER — TRAMADOL HCL 50 MG PO TABS: ORAL_TABLET | ORAL | Status: DC

## 2013-09-14 MED ORDER — KETOROLAC TROMETHAMINE 10 MG PO TABS: 10.0000 mg | ORAL_TABLET | Freq: Three times a day (TID) | ORAL | Status: DC

## 2013-09-14 NOTE — Discharge Instructions (Signed)
Dental Pain  Toothache is pain in or around a tooth. It may get worse with chewing or with cold or heat.   HOME CARE  · Your dentist may use a numbing medicine during treatment. If so, you may need to avoid eating until the medicine wears off. Ask your dentist about this.  · Only take medicine as told by your dentist or doctor.  · Avoid chewing food near the painful tooth until after all treatment is done. Ask your dentist about this.  GET HELP RIGHT AWAY IF:   · The problem gets worse or new problems appear.  · You have a fever.  · There is redness and puffiness (swelling) of the face, jaw, or neck.  · You cannot open your mouth.  · There is pain in the jaw.  · There is very bad pain that is not helped by medicine.  MAKE SURE YOU:   · Understand these instructions.  · Will watch your condition.  · Will get help right away if you are not doing well or get worse.  Document Released: 08/15/2007 Document Revised: 05/21/2011 Document Reviewed: 08/15/2007  ExitCare® Patient Information ©2015 [employer]. This information is not intended to replace advice given to you by your health care provider. Make sure you discuss any questions you have with your health care provider.

## 2013-09-14 NOTE — ED Provider Notes (Signed)
Medical screening examination/treatment/procedure(s) were performed by non-physician practitioner and as supervising physician I was immediately available for consultation/collaboration.   EKG Interpretation None     Benny Lennert'   Odean Mcelwain L Ezrie Bunyan, MD 09/14/13 2351

## 2013-09-14 NOTE — ED Provider Notes (Signed)
CSN: [id number]     Arrival date & time 09/14/13  1601 History   None    Chief Complaint  Patient presents with  . Dental Pain   Patient is a 28 y.o. female presenting with tooth pain. The history is provided by the patient. No language interpreter was used.  Dental Pain Location:  Lower Associated symptoms: no fever    This chart was scribed for non-physician practitioner working with Laura LennertJoseph L Zammit, Laura Vazquez, by Andrew Auaven Small, ED Scribe. This patient was seen in room APFT24/APFT24 and the patient's care was started at 5:25 PM.  Laura Vazquez is a 28 y.o. female who presents to the Emergency Department complaining of lower dental pain x 3 days. Pt states she has 3 toothaches. Pt denies known injury to mouth. [facility] too [facility]. She denies any medical problems. Pt has taken tylenol. Pt also received medication from a friend that she is unable to recall. She denies fever and chills.  Pt reports she is allergic to codeine and clindamycin .   Past Medical History  Diagnosis Date  . Heart murmur   . Other and unspecified ovarian cysts 8/14  . Smoker 2014   History reviewed. No pertinent past surgical history. History reviewed. No pertinent family history. History  Substance Use Topics  . Smoking status: Current Every Day Smoker -- 1.00 packs/day  . Smokeless tobacco: Not on file  . Alcohol Use: No   OB History   Grav Para Term Preterm Abortions TAB SAB Ect Mult Living                 Review of Systems  Constitutional: Negative for fever and chills.  HENT: Positive for dental problem.   All other systems reviewed and are negative.    Allergies  Benadryl; Clindamycin/lincomycin; and Codeine  Home Medications   Prior to Admission medications   Medication Sig Start Date End Date Taking? Authorizing Provider  acetaminophen (TYLENOL) 500 MG tablet Take 1,000 mg by mouth every 6 (six) hours as needed for pain.      Historical Provider, Laura Vazquez  ibuprofen (ADVIL,MOTRIN) 200 MG tablet Take 800 mg by mouth every 6 (six) hours as needed for pain.     Historical Provider, Laura Vazquez  oxyCODONE-acetaminophen (PERCOCET) 5-325 MG per tablet Take 1 tablet by mouth every 6 (six) hours as needed for pain. 10/28/12   Junius ArgyleForrest S Harrison, Laura Vazquez   Triage Vitals- BP 113/48  Pulse 75  Temp(Src) 97.9 F (36.6 C) (Oral)  Resp 18  Ht 4\' 11"  (1.499 m)  Wt 97 lb (43.999 kg)  BMI 19.58 kg/m2  SpO2 98%  LMP 09/04/2013 Physical Exam  Nursing note and vitals reviewed. Constitutional: She is oriented to person, place, and time. She appears well-developed and well-nourished. No distress.  HENT:  Head: Normocephalic and atraumatic.  gingival disease. No visible abscess. No swelling under tongue. Pain to palpation of teeth of the right lower jaw  Eyes: Conjunctivae and EOM are normal.  Neck: Neck supple.  No cervical lymphadenopathy   Cardiovascular: Normal rate and regular rhythm.  Exam reveals no gallop and no friction rub.   No murmur heard. Pulmonary/Chest: Effort normal. She has wheezes ( soft scattered). She has rales.  Musculoskeletal: Normal range of motion.  Neurological: She is alert and oriented to person, place, and time.  Skin: Skin is warm and dry.  Psychiatric: She has a normal mood and affect.  Her behavior is normal.    ED Course  Procedures  DIAGNOSTIC STUDIES: Oxygen Saturation is 98% on RA, normal by my interpretation.    COORDINATION OF CARE: 5:25 PM-Discussed treatment plan which includes abx, antiinflammatory and ultram   Labs Review Labs Reviewed - No data to display  Imaging Review No results found.   EKG Interpretation None      MDM Dental pain noted of the right lower jaw area. Vital signs are within normal limits. Pulse oximetry is 98% on room air. Within normal limits by my interpretation. NO evidence for Ludwig's Angina.  The plan at this time is for the patient be treated with Amoxil,  Toradol, and Ultram. Patient advised to see a dentist as soon as possible.     Final diagnoses:  Pain, dental     I personally performed the services described in this documentation, which was scribed in my presence. The recorded information has been reviewed and is accurate.      Kathie DikeHobson M Scotti Kosta, PA-C 09/14/13 2324

## 2013-09-14 NOTE — ED Notes (Signed)
Dental pain for 3 days  

## 2013-10-19 ENCOUNTER — Encounter (HOSPITAL_COMMUNITY): Payer: Self-pay | Admitting: Emergency Medicine

## 2013-10-19 ENCOUNTER — Emergency Department (HOSPITAL_COMMUNITY)
Admission: EM | Admit: 2013-10-19 | Discharge: 2013-10-19 | Disposition: A | Discharge status: Home / Self Care | Payer: Medicaid Other | Attending: Emergency Medicine | Admitting: Emergency Medicine

## 2013-10-19 DIAGNOSIS — L0231 Cutaneous abscess of buttock: Secondary | ICD-10-CM | POA: Insufficient documentation

## 2013-10-19 DIAGNOSIS — R011 Cardiac murmur, unspecified: Secondary | ICD-10-CM | POA: Insufficient documentation

## 2013-10-19 DIAGNOSIS — F17200 Nicotine dependence, unspecified, uncomplicated: Secondary | ICD-10-CM | POA: Insufficient documentation

## 2013-10-19 DIAGNOSIS — Z87448 Personal history of other diseases of urinary system: Secondary | ICD-10-CM | POA: Insufficient documentation

## 2013-10-19 DIAGNOSIS — L03317 Cellulitis of buttock: Principal | ICD-10-CM

## 2013-10-19 DIAGNOSIS — L0291 Cutaneous abscess, unspecified: Secondary | ICD-10-CM

## 2013-10-19 DIAGNOSIS — Z792 Long term (current) use of antibiotics: Secondary | ICD-10-CM | POA: Insufficient documentation

## 2013-10-19 MED ORDER — HYDROCODONE-ACETAMINOPHEN 5-325 MG PO TABS: 1.0000 | ORAL_TABLET | ORAL | Status: DC | PRN

## 2013-10-19 MED ORDER — LIDOCAINE HCL (PF) 1 % IJ SOLN
5.0000 mL | Freq: Once | INTRAMUSCULAR | Status: AC
  Administered 2013-10-19: 5 mL
  Filled 2013-10-19: qty 5

## 2013-10-19 MED ORDER — SULFAMETHOXAZOLE-TRIMETHOPRIM 800-160 MG PO TABS: 1.0000 | ORAL_TABLET | Freq: Two times a day (BID) | ORAL | Status: DC

## 2013-10-19 NOTE — Discharge Instructions (Signed)
Abscess Care After An abscess (also called a boil or furuncle) is an infected area that contains a collection of pus. Signs and symptoms of an abscess include pain, tenderness, redness, or hardness, or you may feel a moveable soft area under your skin. An abscess can occur anywhere in the body. The infection may spread to surrounding tissues causing cellulitis. A cut (incision) by the surgeon was made over your abscess and the pus was drained out. Gauze may have been packed into the space to provide a drain that will allow the cavity to heal from the inside outwards. The boil may be painful for 5 to 7 days. Most people with a boil do not have high fevers. Your abscess, if seen early, may not have localized, and may not have been lanced. If not, another appointment may be required for this if it does not get better on its own or with medications. HOME CARE INSTRUCTIONS   Only take over-the-counter or prescription medicines for pain, discomfort, or fever as directed by your caregiver.  When you bathe, soak and then remove gauze or iodoform packs at least daily or as directed by your caregiver. You may then wash the wound gently with mild soapy water. Repack with gauze or do as your caregiver directs. SEEK IMMEDIATE MEDICAL CARE IF:   You develop increased pain, swelling, redness, drainage, or bleeding in the wound site.  You develop signs of generalized infection including muscle aches, chills, fever, or a general ill feeling.  An oral temperature above 102 F (38.9 C) develops, not controlled by medication. See your caregiver for a recheck if you develop any of the symptoms described above. If medications (antibiotics) were prescribed, take them as directed. Document Released: 09/14/2004 Document Revised: 05/21/2011 Document Reviewed: 05/12/2007 [facility] Patient Information 2015 [place] [place]. This information is not intended to replace advice given to you by your health care provider. Make sure  you discuss any questions you have with your health care provider.   You may take the hydrocodone prescribed for pain relief.  This will make you drowsy - do not drive within 4 hours of taking this medication. In addition to the hydrocodone,  You can take ibuprofen (advil or motrin) 2 tablets every 6 hours for additional pain relief.

## 2013-10-19 NOTE — ED Notes (Signed)
Pt reports abscess to buttocks x 1 week. 

## 2013-10-19 NOTE — ED Provider Notes (Signed)
CSN: [id number]     Arrival date & time 10/19/13  1458 History  This chart was scribed for a non-physician practitioner, Burgess Amor, PA-C, working with Vanetta Mulders, MD by Julian Hy, ED Scribe. The patient was seen in APFT23/APFT23. The patient's care was started at 3:07 PM.  Chief Complaint  Patient presents with  . Abscess   The history is provided by the patient. No language interpreter was used.   HPI Comments: Laura Vazquez is a 28 y.o. female who presents to the Emergency Department complaining of new, gradually worsening abscess on her right buttocks onset one week ago. Pt reports associated pain and redness near wound site. Pt reports she thought it started as a pimple. Pt reports she has attempted to pop it and soak in warm water with no relief. Pt reports after she attempted to squeeze it, the abscesses got larger in size. Pt denies fever, chills, nausea or vomiting.  Pt denies having a PCP. Pt reports past medical history of heart murmur. Pt is currently menstruating. Pt denies she is currently working. Past Medical History  Diagnosis Date  . Heart murmur   . Other and unspecified ovarian cysts 8/14  . Smoker 2014   History reviewed. No pertinent past surgical history. No family history on file. History  Substance Use Topics  . Smoking status: Current Every Day Smoker -- 1.00 packs/day  . Smokeless tobacco: Not on file  . Alcohol Use: No   OB History   Grav Para Term Preterm Abortions TAB SAB Ect Mult Living                 Review of Systems  Constitutional: Negative for fever and chills.  HENT: Negative for drooling.   Eyes: Negative for visual disturbance.  Respiratory: Negative for shortness of breath and wheezing.   Gastrointestinal: Negative for nausea and vomiting.  Genitourinary: Negative for menstrual problem.  Skin: Positive for rash (absecess on right buttocks).  Neurological: Negative for weakness and numbness.  Psychiatric/Behavioral: Negative  for behavioral problems.   Allergies  Benadryl; Clindamycin/lincomycin; and Codeine  Home Medications   Prior to Admission medications   Medication Sig Start Date End Date Taking? Authorizing Provider  acetaminophen (TYLENOL) 500 MG tablet Take 1,000 mg by mouth every 6 (six) hours as needed for pain.    Yes Historical Provider, MD  HYDROcodone-acetaminophen (NORCO/VICODIN) 5-325 MG per tablet Take 1 tablet by mouth every 4 (four) hours as needed. 10/19/13   Burgess Amor, PA-C  sulfamethoxazole-trimethoprim (SEPTRA DS) 800-160 MG per tablet Take 1 tablet by mouth every 12 (twelve) hours. 10/19/13   Burgess Amor, PA-C   Triage Vitals: BP 133/66  Pulse 73  Temp(Src) 97.8 F (36.6 C) (Oral)  Resp 18  Ht 4\' 11"  (1.499 m)  Wt 100 lb (45.36 kg)  BMI 20.19 kg/m2  SpO2 100%  LMP 10/17/2013 Physical Exam  Nursing note and vitals reviewed. Constitutional: She is oriented to person, place, and time. She appears well-developed and well-nourished. No distress.  HENT:  Head: Normocephalic.  Neck: Neck supple.  Cardiovascular: Normal rate.   Pulmonary/Chest: Effort normal. She has no wheezes.  Musculoskeletal: Normal range of motion. She exhibits no edema.  Neurological: She is alert and oriented to person, place, and time. No sensory deficit.  Skin:  Small indurated abscess midline at pilonidal space.  No induration or red streaking.   ED Course  Procedures (including critical care time) DIAGNOSTIC STUDIES: Oxygen Saturation is 100% on RA, normal by my  interpretation.    COORDINATION OF CARE: 3:14 PM- Will order Xylocaine 1% injection 5 mL. Patient informed of current plan for treatment and evaluation and agrees with plan at this time.  3:17 PM - INCISION AND DRAINAGE PROCEDURE NOTE: Patient identification was confirmed and verbal consent was obtained. This procedure was performed by Burgess AmorJulie Dayton Kenley, PA-C at 3:39 PM. Site: Right buttocks Sterile procedures observed Needle size: 25  gauge Anesthetic used (type and amt): Xylocaine 1% injection 3mL. Blade size: 11 Drainage: trace purulence  Complexity: Complex Packing used: no Site anesthetized, incision made over site, wound drained and explored loculations, rinsed with copious amounts of normal saline, wound packed with sterile gauze, covered with dry, sterile dressing.  Pt tolerated procedure well without complications.  Instructions for care discussed verbally and pt provided with additional written instructions for homecare and f/u.  MDM   Final diagnoses:  Abscess    Pt prescribed hydrocdone, bactrim, advised sitz baths.  Recheck if not improving or for any worsened sx.  Pt understands and agrees with plan.  I personally performed the services described in this documentation, which was scribed in my presence. The recorded information has been reviewed and is accurate.   Burgess AmorJulie Damonta Cossey, PA-C 10/21/13 [phone]

## 2013-10-22 NOTE — ED Provider Notes (Signed)
Medical screening examination/treatment/procedure(s) were performed by non-physician practitioner and as supervising physician I was immediately available for consultation/collaboration.   EKG Interpretation None        Emiel Kielty, MD 10/22/13 0403 

## 2013-12-05 ENCOUNTER — Encounter (HOSPITAL_COMMUNITY): Payer: Self-pay | Admitting: Emergency Medicine

## 2013-12-05 ENCOUNTER — Emergency Department (HOSPITAL_COMMUNITY)
Admission: EM | Admit: 2013-12-05 | Discharge: 2013-12-05 | Disposition: A | Discharge status: Home / Self Care | Payer: Medicaid Other | Attending: Emergency Medicine | Admitting: Emergency Medicine

## 2013-12-05 ENCOUNTER — Emergency Department (HOSPITAL_COMMUNITY): Payer: Medicaid Other

## 2013-12-05 DIAGNOSIS — IMO0002 Reserved for concepts with insufficient information to code with codable children: Secondary | ICD-10-CM | POA: Insufficient documentation

## 2013-12-05 DIAGNOSIS — S46911A Strain of unspecified muscle, fascia and tendon at shoulder and upper arm level, right arm, initial encounter: Secondary | ICD-10-CM

## 2013-12-05 DIAGNOSIS — Z8742 Personal history of other diseases of the female genital tract: Secondary | ICD-10-CM | POA: Insufficient documentation

## 2013-12-05 DIAGNOSIS — S46909A Unspecified injury of unspecified muscle, fascia and tendon at shoulder and upper arm level, unspecified arm, initial encounter: Secondary | ICD-10-CM | POA: Insufficient documentation

## 2013-12-05 DIAGNOSIS — R011 Cardiac murmur, unspecified: Secondary | ICD-10-CM | POA: Insufficient documentation

## 2013-12-05 DIAGNOSIS — F17200 Nicotine dependence, unspecified, uncomplicated: Secondary | ICD-10-CM | POA: Insufficient documentation

## 2013-12-05 DIAGNOSIS — Y9289 Other specified places as the place of occurrence of the external cause: Secondary | ICD-10-CM | POA: Insufficient documentation

## 2013-12-05 DIAGNOSIS — Y990 Civilian activity done for income or pay: Secondary | ICD-10-CM | POA: Insufficient documentation

## 2013-12-05 DIAGNOSIS — S4980XA Other specified injuries of shoulder and upper arm, unspecified arm, initial encounter: Secondary | ICD-10-CM | POA: Insufficient documentation

## 2013-12-05 DIAGNOSIS — Y9389 Activity, other specified: Secondary | ICD-10-CM | POA: Insufficient documentation

## 2013-12-05 MED ORDER — IBUPROFEN 600 MG PO TABS: 600.0000 mg | ORAL_TABLET | Freq: Four times a day (QID) | ORAL | Status: DC | PRN

## 2013-12-05 MED ORDER — HYDROCODONE-ACETAMINOPHEN 5-325 MG PO TABS
1.0000 | ORAL_TABLET | Freq: Once | ORAL | Status: AC
  Administered 2013-12-05: 1 via ORAL
  Filled 2013-12-05: qty 1

## 2013-12-05 MED ORDER — IBUPROFEN 400 MG PO TABS
400.0000 mg | ORAL_TABLET | Freq: Once | ORAL | Status: AC
  Administered 2013-12-05: 400 mg via ORAL
  Filled 2013-12-05: qty 1

## 2013-12-05 MED ORDER — HYDROCODONE-ACETAMINOPHEN 5-325 MG PO TABS: ORAL_TABLET | ORAL | Status: DC

## 2013-12-05 NOTE — ED Notes (Signed)
Patient with no complaints at this time. Respirations even and unlabored. Skin warm/dry. Discharge instructions reviewed with patient at this time. Patient given opportunity to voice concerns/ask questions. Patient discharged at this time and left Emergency Department with steady gait.   

## 2013-12-05 NOTE — ED Notes (Signed)
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  Noticed some mild shoulder pain yesterday while playing with children.  Upon waking this morning, pain was more intense and included R pectoralis and trapezius pain.  Has treated w/Tylenol w/mild relief. When returning to work this evening, pain became severe and she couldn't raise R arm above 90 degrees w/out intense pain.

## 2013-12-05 NOTE — Discharge Instructions (Signed)

## 2013-12-05 NOTE — ED Notes (Addendum)
Patients states she "may have hit" her arm a couple days ago. Comes in today because the pain has increased and is moving from her right chest to her back. Worse with movement

## 2013-12-07 NOTE — ED Provider Notes (Signed)
CSN: [id number]     Arrival date & time 12/05/13  1729 History   First MD Initiated Contact with Patient 12/05/13 1738     Chief Complaint  Patient presents with  . Arm Pain     (Consider location/radiation/quality/duration/timing/severity/associated sxs/prior Treatment) HPI  Laura Vazquez is a 28 y.o. female who presents to the Emergency Department complaining of right shoulder pain for 2 days.  She states that she may have hit her arm on something and performs heavy lifting at her job and believes she injured her arm.  She describes sharp pain to her right arm with movement and reaching.  She states the pain resolves with her arm held to her chest.  She denies neck pain, fever, chest pain, shortness of breath, headaches, numbness or weakness of the extremity.  She has not taken anything for the pain.      Past Medical History  Diagnosis Date  . Heart murmur   . Other and unspecified ovarian cysts 8/14  . Smoker 2014   History reviewed. No pertinent past surgical history. History reviewed. No pertinent family history. History  Substance Use Topics  . Smoking status: Current Every Day Smoker -- 1.00 packs/day  . Smokeless tobacco: Not on file  . Alcohol Use: No   OB History   Grav Para Term Preterm Abortions TAB SAB Ect Mult Living                 Review of Systems  Constitutional: Negative for fever and chills.  Genitourinary: Negative for dysuria and difficulty urinating.  Musculoskeletal: Positive for arthralgias and joint swelling.  Skin: Negative for color change and wound.  All other systems reviewed and are negative.     Allergies  Benadryl; Clindamycin/lincomycin; and Codeine  Home Medications   Prior to Admission medications   Medication Sig Start Date End Date Taking? Authorizing Provider  HYDROcodone-acetaminophen (NORCO/VICODIN) 5-325 MG per tablet Take one tab po q 4-6 hrs prn pain 12/05/13   Rolinda Impson L. Aune Adami, PA-C  ibuprofen (ADVIL,MOTRIN) 600 MG  tablet Take 1 tablet (600 mg total) by mouth every 6 (six) hours as needed. 12/05/13   Christoher Drudge L. Jamieson Lisa, PA-C   BP 138/71  Pulse 88  Temp(Src) 98.2 F (36.8 C) (Oral)  Resp 16  Ht  (1.499 m)  Wt 100 lb (45.36 kg)  BMI 20.19 kg/m2  SpO2 100%  LMP 11/19/2013 Physical Exam  Nursing note and vitals reviewed. Constitutional: She is oriented to person, place, and time. She appears well-developed and well-nourished. No distress.  HENT:  Head: Normocephalic and atraumatic.  Neck: Normal range of motion, full passive range of motion without pain and phonation normal. Neck supple. No spinous process tenderness and no muscular tenderness present. Normal range of motion present. No thyromegaly present.  Cardiovascular: Normal rate, regular rhythm, normal heart sounds and intact distal pulses.   No murmur heard. Pulmonary/Chest: Effort normal and breath sounds normal. No respiratory distress. She exhibits no tenderness.  Musculoskeletal: She exhibits tenderness. She exhibits no edema.       Right shoulder: She exhibits tenderness and pain. She exhibits normal range of motion, no bony tenderness, no swelling, no effusion, no crepitus, no deformity, normal pulse and normal strength.       Arms: ttp of the anterior right shoulder and over the deltoid muscle.  Pain with abduction of the right arm.  Radial pulse is brisk, distal sensation intact, CR< 2 sec. Grip strength is strong and symmetrical.  No abrasions, edema , erythema or step-off deformity of the joint.   Lymphadenopathy:    She has no cervical adenopathy.  Neurological: She is alert and oriented to person, place, and time. She has normal strength. No sensory deficit. She exhibits normal muscle tone. Coordination normal.  Skin: Skin is warm and dry.    ED Course  Procedures (including critical care time) Labs Review Labs Reviewed - No data to display  Imaging Review Dg Shoulder Right  12/05/2013   CLINICAL DATA:  Right shoulder  pain.  No known injury.  EXAM: RIGHT SHOULDER - 2+ VIEW  COMPARISON:  None.  FINDINGS: There is no evidence of fracture or dislocation. There is no evidence of arthropathy or other focal bone abnormality. Soft tissues are unremarkable.  IMPRESSION: Negative.   Electronically Signed   By: Rosalie Gums M.D.   On: 12/05/2013 18:25     EKG Interpretation None      MDM   Final diagnoses:  Muscle strain, shoulder region, right, initial encounter     Pt is well appearing, VSS.  No concerning sx's for septic joint.  NV intact.  Pain is reproduced with palpation and movement of the right shoulder.  Likely musculoskeletal injury.  Pt agrees to symptomatic treatment with ibuprofen and hydrocodone.  Referral to ortho also given.   Patient appears stable for d/c.     [place] Kirchner L. Trisha Mangle, PA-C 12/07/13 1151

## 2013-12-08 NOTE — ED Provider Notes (Signed)
Medical screening examination/treatment/procedure(s) were performed by non-physician practitioner and as supervising physician I was immediately available for consultation/collaboration.   EKG Interpretation None       Shawnda Mauney, MD 12/08/13 2303 

## 2014-02-15 ENCOUNTER — Emergency Department (HOSPITAL_COMMUNITY)
Admission: EM | Admit: 2014-02-15 | Discharge: 2014-02-15 | Disposition: A | Discharge status: Home / Self Care | Payer: Medicaid Other | Attending: Emergency Medicine | Admitting: Emergency Medicine

## 2014-02-15 ENCOUNTER — Encounter (HOSPITAL_COMMUNITY): Payer: Self-pay | Admitting: Cardiology

## 2014-02-15 DIAGNOSIS — Z792 Long term (current) use of antibiotics: Secondary | ICD-10-CM | POA: Insufficient documentation

## 2014-02-15 DIAGNOSIS — J039 Acute tonsillitis, unspecified: Secondary | ICD-10-CM | POA: Insufficient documentation

## 2014-02-15 DIAGNOSIS — Z8673 Personal history of transient ischemic attack (TIA), and cerebral infarction without residual deficits: Secondary | ICD-10-CM | POA: Insufficient documentation

## 2014-02-15 DIAGNOSIS — Z720 Tobacco use: Secondary | ICD-10-CM | POA: Insufficient documentation

## 2014-02-15 DIAGNOSIS — Z87448 Personal history of other diseases of urinary system: Secondary | ICD-10-CM | POA: Insufficient documentation

## 2014-02-15 DIAGNOSIS — R011 Cardiac murmur, unspecified: Secondary | ICD-10-CM | POA: Insufficient documentation

## 2014-02-15 MED ORDER — IBUPROFEN 600 MG PO TABS: 600.0000 mg | ORAL_TABLET | Freq: Three times a day (TID) | ORAL | Status: DC

## 2014-02-15 MED ORDER — AMOXICILLIN 500 MG PO CAPS: 500.0000 mg | ORAL_CAPSULE | Freq: Three times a day (TID) | ORAL | Status: DC

## 2014-02-15 NOTE — ED Notes (Signed)
sorethroat times 1 day

## 2014-02-15 NOTE — Care Management Note (Signed)
ED/CM noted patient did not have health insurance and/or PCP listed in the computer.  Pt was given the handout for new health insurance sign-up. Pt was also given a Rx assistance/ discount card. She is from [place], so was not given the [employer] handout. Patient expressed appreciation for information received.

## 2014-02-15 NOTE — ED Provider Notes (Signed)
CSN: [id number]     Arrival date & time 02/15/14  [id number] History   First MD Initiated Contact with Patient 02/15/14 1002     Chief Complaint  Patient presents with  . Sore Throat     (Consider location/radiation/quality/duration/timing/severity/associated sxs/prior Treatment) HPI  Laura Vazquez is a 28 y.o. female who presents to the Emergency Department complaining of sore throat that began one day ago.  She reports hx of frequent strep throat infections and states her symptoms today feel similar.  She also reports having pain with swallowing, generalized body aches, chills and swollen glands in her neck.  She states that she has been exposed to possible strep throat as well.  She denies known fever, vomiting, neck stiffness or abdominal pain.  She has not taken anything for her symptoms    Past Medical History  Diagnosis Date  . Heart murmur   . Other and unspecified ovarian cysts 8/14  . Smoker 2014   History reviewed. No pertinent past surgical history. History reviewed. No pertinent family history. History  Substance Use Topics  . Smoking status: Current Every Day Smoker -- 1.00 packs/day  . Smokeless tobacco: Not on file  . Alcohol Use: No   OB History    No data available     Review of Systems  Constitutional: Positive for chills. Negative for fever, activity change and appetite change.  HENT: Positive for congestion and sore throat. Negative for ear pain, facial swelling, trouble swallowing and voice change.   Eyes: Negative for pain and visual disturbance.  Respiratory: Negative for cough and shortness of breath.   Gastrointestinal: Negative for nausea, vomiting and abdominal pain.  Musculoskeletal: Positive for myalgias. Negative for arthralgias, neck pain and neck stiffness.  Skin: Negative for color change and rash.  Neurological: Negative for dizziness, facial asymmetry, speech difficulty, numbness and headaches.  Hematological: Positive for adenopathy.  All  other systems reviewed and are negative.     Allergies  Benadryl; Clindamycin/lincomycin; Codeine; and Tramadol  Home Medications   Prior to Admission medications   Medication Sig Start Date End Date Taking? Authorizing Provider  acetaminophen (TYLENOL) 500 MG tablet Take 1,000 mg by mouth every 6 (six) hours as needed for moderate pain or fever.   Yes Historical Provider, MD  amoxicillin (AMOXIL) 500 MG capsule Take 1 capsule (500 mg total) by mouth 3 (three) times daily. For 10 days 02/15/14   Jennelle Pinkstaff L. Sheree Lalla, PA-C  ibuprofen (ADVIL,MOTRIN) 600 MG tablet Take 1 tablet (600 mg total) by mouth 3 (three) times daily. Take with food 02/15/14   Nthony Lefferts L. Valentina Alcoser, PA-C   BP 119/68 mmHg  Pulse 77  Temp(Src) 98.5 F (36.9 C) (Oral)  Resp 16  Ht 4\' 11"  (1.499 m)  Wt 100 lb (45.36 kg)  BMI 20.19 kg/m2  SpO2 100%  LMP 02/03/2014 Physical Exam  Constitutional: She is oriented to person, place, and time. She appears well-developed and well-nourished. No distress.  HENT:  Head: Normocephalic and atraumatic.  Right Ear: Tympanic membrane and ear canal normal.  Left Ear: Tympanic membrane and ear canal normal.  Mouth/Throat: Uvula is midline and mucous membranes are normal. No trismus in the jaw. No uvula swelling. Posterior oropharyngeal edema and posterior oropharyngeal erythema present. No oropharyngeal exudate or tonsillar abscesses.  Neck: Normal range of motion, full passive range of motion without pain and phonation normal. Neck supple. No Kernig's sign noted.  Cardiovascular: Normal rate and regular rhythm.   Pulmonary/Chest: Effort normal and breath sounds  normal. No respiratory distress.  Abdominal: Soft. She exhibits no mass. There is no splenomegaly. There is no tenderness. There is no rebound and no guarding.  Musculoskeletal: Normal range of motion.  Lymphadenopathy:    She has cervical adenopathy.       Right cervical: Superficial cervical adenopathy present.       Left  cervical: Superficial cervical adenopathy present.  Neurological: She is alert and oriented to person, place, and time. She exhibits normal muscle tone. Coordination normal.  Skin: Skin is warm and dry. No rash noted.  Nursing note and vitals reviewed.   ED Course  Procedures (including critical care time) Labs Review Labs Reviewed - No data to display  Imaging Review No results found.   EKG Interpretation None      MDM   Final diagnoses:  Tonsillitis    Patient is well appearing, non-toxic.  Airway is patent, no PTA.  Agrees to ibuprofen, fluids and amoxil.  Advised to f/u with her PMD if needed.      Viva Gallaher L. Trisha Mangleriplett, PA-C 02/15/14 1033  Samuel JesterKathleen McManus, DO 02/17/14 2308

## 2014-03-07 ENCOUNTER — Emergency Department (HOSPITAL_COMMUNITY): Payer: Self-pay

## 2014-03-07 ENCOUNTER — Emergency Department (HOSPITAL_COMMUNITY)
Admission: EM | Admit: 2014-03-07 | Discharge: 2014-03-07 | Disposition: A | Discharge status: Home / Self Care | Payer: Self-pay | Attending: Emergency Medicine | Admitting: Emergency Medicine

## 2014-03-07 ENCOUNTER — Encounter (HOSPITAL_COMMUNITY): Payer: Self-pay | Admitting: Emergency Medicine

## 2014-03-07 DIAGNOSIS — M545 Low back pain, unspecified: Secondary | ICD-10-CM

## 2014-03-07 DIAGNOSIS — R05 Cough: Secondary | ICD-10-CM

## 2014-03-07 DIAGNOSIS — R011 Cardiac murmur, unspecified: Secondary | ICD-10-CM | POA: Insufficient documentation

## 2014-03-07 DIAGNOSIS — R000 Tachycardia, unspecified: Secondary | ICD-10-CM | POA: Insufficient documentation

## 2014-03-07 DIAGNOSIS — M62830 Muscle spasm of back: Secondary | ICD-10-CM | POA: Insufficient documentation

## 2014-03-07 DIAGNOSIS — J40 Bronchitis, not specified as acute or chronic: Secondary | ICD-10-CM

## 2014-03-07 DIAGNOSIS — R059 Cough, unspecified: Secondary | ICD-10-CM

## 2014-03-07 DIAGNOSIS — Z720 Tobacco use: Secondary | ICD-10-CM | POA: Insufficient documentation

## 2014-03-07 DIAGNOSIS — G8929 Other chronic pain: Secondary | ICD-10-CM

## 2014-03-07 DIAGNOSIS — Z8742 Personal history of other diseases of the female genital tract: Secondary | ICD-10-CM | POA: Insufficient documentation

## 2014-03-07 DIAGNOSIS — J209 Acute bronchitis, unspecified: Secondary | ICD-10-CM | POA: Insufficient documentation

## 2014-03-07 MED ORDER — ALBUTEROL SULFATE HFA 108 (90 BASE) MCG/ACT IN AERS
2.0000 | INHALATION_SPRAY | Freq: Once | RESPIRATORY_TRACT | Status: AC
  Administered 2014-03-07: 2 via RESPIRATORY_TRACT
  Filled 2014-03-07: qty 6.7

## 2014-03-07 MED ORDER — AZITHROMYCIN 250 MG PO TABS: 250.0000 mg | ORAL_TABLET | Freq: Every day | ORAL | Status: DC

## 2014-03-07 MED ORDER — HYDROCODONE-ACETAMINOPHEN 5-325 MG PO TABS: 1.0000 | ORAL_TABLET | Freq: Four times a day (QID) | ORAL | Status: DC | PRN

## 2014-03-07 MED ORDER — PREDNISONE 10 MG PO TABS: 20.0000 mg | ORAL_TABLET | Freq: Two times a day (BID) | ORAL | Status: DC

## 2014-03-07 NOTE — ED Notes (Signed)
PT c/o non-productive cough with nasal drainage x2 days and lower back pain x1 week. PT denies any fever or urinary symptoms.

## 2014-03-07 NOTE — ED Provider Notes (Signed)
CSN: [id number]     Arrival date & time 03/07/14  1111 History  This chart was scrib[facility] for [facility] Sed[facility] Tober, NP, working with Joya Gaskinsonald W Wickline, MD by Chestine SporeSoijett Blue, ED Scribe. The patient was seen in room APFT20/APFT20 at 12:46 PM.    Chief Complaint  Patient presents with  . Cough  . Back Pain    Patient is a 28 y.o. female presenting with cough and back pain. The history is provided by the patient. No language interpreter was used.  Cough Cough characteristics:  Non-productive and productive Sputum characteristics:  Clear and white Severity:  Moderate Duration:  3 days Timing:  Constant Progression:  Unchanged Chronicity:  New Smoker: yes (2 PPD)   Relieved by:  Beta-agonist inhaler and home nebulizer Ineffective treatments: Ibuprofen. Associated symptoms: rhinorrhea and sinus congestion   Associated symptoms: no chills and no fever   Back Pain Location:  Lumbar spine Duration:  1 week Associated symptoms: no bladder incontinence, no dysuria and no fever     HPI Comments: Laura Vazquez is a 28 y.o. female with a hx of scoliosis that was dx when she was young who presents to the Emergency Department complaining of non-productive cough onset 3 days. The cough is productive of white clear mucus She states that she is having associated symptoms of low back pain, nasal congestion, rhinorrhea. The low back pain has been occurring for 1 week. She states that she has tried 800 mg Ibuprofen, Nebulizer and albuterol inhaler with mild relief for her symptoms. She used this treatment PTA today. She denies fever, dysuria, bowel/bladder incontinence, frequency, chills, and any other symptoms. She smokes 2 PPD. She reports that her mother has COPD and gets bronchitis every year and that is what she thinks it is.    Past Medical History  Diagnosis Date  . Heart murmur   . Other and unspecified ovarian cysts 8/14  . Smoker 2014   History reviewed. No pertinent past surgical history. No family  history on file. History  Substance Use Topics  . Smoking status: Current Every Day Smoker -- 2.00 packs/day    Types: Cigarettes  . Smokeless tobacco: Not on file  . Alcohol Use: No   OB History    No data available     Review of Systems  Constitutional: Negative for fever and chills.  HENT: Positive for congestion and rhinorrhea.   Respiratory: Positive for cough.   Gastrointestinal:       No bowel incontinence  Genitourinary: Negative for bladder incontinence, dysuria and frequency.       No bladder incontinence  Musculoskeletal: Positive for back pain.  All other systems reviewed and are negative.     Allergies  Benadryl; Clindamycin/lincomycin; Codeine; and Tramadol  Home Medications   Prior to Admission medications   Medication Sig Start Date End Date Taking? Authorizing Provider  acetaminophen (TYLENOL) 500 MG tablet Take 1,000 mg by mouth every 6 (six) hours as needed for moderate pain or fever.    Historical Provider, MD  amoxicillin (AMOXIL) 500 MG capsule Take 1 capsule (500 mg total) by mouth 3 (three) times daily. For 10 days 02/15/14   Tammy L. Triplett, PA-C  azithromycin (ZITHROMAX) 250 MG tablet Take 1 tablet (250 mg total) by mouth daily. Take first 2 tablets together, then 1 every day until finished. 03/07/14   Nakyla Bracco Orlene OchM Markiesha Delia, NP  HYDROcodone-acetaminophen (NORCO/VICODIN) 5-325 MG per tablet Take 1 tablet by mouth every 6 (six) hours as needed. 03/07/14  Armarion Greek Orlene OchM Drema Eddington, NP  ibuprofen (ADVIL,MOTRIN) 600 MG tablet Take 1 tablet (600 mg total) by mouth 3 (three) times daily. Take with food 02/15/14   Tammy L. Triplett, PA-C  predniSONE (DELTASONE) 10 MG tablet Take 2 tablets (20 mg total) by mouth 2 (two) times daily with a meal. 03/07/14   Asaph Serena Orlene OchM Promyse Ardito, NP   BP 118/62 mmHg  Pulse 110  Temp(Src) 98.6 F (37 C) (Oral)  Resp 18  Ht 4\' 11"  (1.499 m)  Wt 100 lb (45.36 kg)  BMI 20.19 kg/m2  SpO2 100%  LMP 03/06/2014  Physical Exam  Constitutional: She is  oriented to person, place, and time. She appears well-developed and well-nourished. No distress.  Awake, alert, nontoxic appearance with baseline speech.  HENT:  Head: Normocephalic and atraumatic.  Right Ear: Tympanic membrane and external ear normal.  Left Ear: Tympanic membrane and external ear normal.  Nose: Rhinorrhea present.  Mouth/Throat: Uvula is midline, oropharynx is clear and moist and mucous membranes are normal. No oropharyngeal exudate, posterior oropharyngeal edema or posterior oropharyngeal erythema.  Eyes: Conjunctivae and EOM are normal. Pupils are equal, round, and reactive to light. Right eye exhibits no discharge. Left eye exhibits no discharge.  Neck: Normal range of motion. Neck supple.  Cardiovascular: Regular rhythm.  Tachycardia present.   No murmur heard. Pulmonary/Chest: Effort normal. No respiratory distress. She has wheezes. She has no rales.  Abdominal: Soft. Bowel sounds are normal. She exhibits no mass. There is no tenderness. There is no rebound.  Musculoskeletal: Normal range of motion.       Thoracic back: She exhibits no tenderness.       Lumbar back: She exhibits pain and spasm. She exhibits no tenderness and normal pulse.  Bilateral lower extremities non tender DTR's symmetric and intact bilaterally KJ / AJ, motor symmetric bilateral 5 / 5 hip flexion, quadriceps, hamstrings, foot dorsiflexion, foot plantarflexion  Neurological: She is alert and oriented to person, place, and time. She has normal strength. No cranial nerve deficit or sensory deficit. Gait normal.  Reflex Scores:      Bicep reflexes are 2+ on the right side and 2+ on the left side.      Brachioradialis reflexes are 2+ on the right side and 2+ on the left side.      Patellar reflexes are 2+ on the right side and 2+ on the left side.      Achilles reflexes are 2+ on the right side and 2+ on the left side. Skin: Skin is warm and dry. No rash noted. She is not diaphoretic.  Psychiatric: She  has a normal mood and affect. Her behavior is normal.  Nursing note and vitals reviewed.   ED Course  Procedures (including critical care time) DIAGNOSTIC STUDIES: Oxygen Saturation is 100% on room air, normal by my interpretation.    COORDINATION OF CARE: 12:50 PM-Discussed treatment plan which includes CXR, azithromycin, prednisone, albuterol inhaler, hydrocodone with pt at bedside and pt agreed to plan.  Imaging Review Dg Chest 2 View  03/07/2014   CLINICAL DATA:  Cough for 2 days.  EXAM: CHEST  2 VIEW  COMPARISON:  None.  FINDINGS: The heart size and mediastinal contours are within normal limits. Both lungs are clear. The visualized skeletal structures are unremarkable.  IMPRESSION: No active cardiopulmonary disease.   Electronically Signed   By: Richarda OverlieAdam  Henn M.D.   On: 03/07/2014 12:21    MDM  28 y.o. female who smokes 2ppd, with cough congestion and  chronic low back pain. I have reviewed this patient's vital signs, nurses notes, appropriate imaging and discussed findings and plan of care with the patient. I have encouraged her to stop smoking. I discussed that I am treating her with medication to help stop her cough but it will also help her back pain. She voices understanding and agrees with plan.    Medication List    TAKE these medications        azithromycin 250 MG tablet  Commonly known as:  ZITHROMAX  Take 1 tablet (250 mg total) by mouth daily. Take first 2 tablets together, then 1 every day until finished.     HYDROcodone-acetaminophen 5-325 MG per tablet  Commonly known as:  NORCO/VICODIN  Take 1 tablet by mouth every 6 (six) hours as needed.     predniSONE 10 MG tablet  Commonly known as:  DELTASONE  Take 2 tablets (20 mg total) by mouth 2 (two) times daily with a meal.      ASK your doctor about these medications        acetaminophen 500 MG tablet  Commonly known as:  TYLENOL  Take 1,000 mg by mouth every 6 (six) hours as needed for moderate pain or fever.      amoxicillin 500 MG capsule  Commonly known as:  AMOXIL  Take 1 capsule (500 mg total) by mouth 3 (three) times daily. For 10 days     ibuprofen 600 MG tablet  Commonly known as:  ADVIL,MOTRIN  Take 1 tablet (600 mg total) by mouth 3 (three) times daily. Take with food           Final diagnoses:  Bronchitis  Chronic low back pain   I personally performed the services described in this documentation, which was scribed in my presence. The recorded information has been reviewed and is accurate.    [place], NP 03/12/14 1329  Joya Gaskins, MD 03/12/14 [phone]

## 2014-06-20 ENCOUNTER — Emergency Department
Admit: 2014-06-20 | Disposition: A | Discharge status: Home / Self Care | Payer: Self-pay | Admitting: Emergency Medicine

## 2014-06-20 LAB — URINALYSIS, COMPLETE
BLOOD: NEGATIVE
Bacteria: NONE SEEN
Bilirubin,UR: NEGATIVE
Glucose,UR: NEGATIVE mg/dL (ref 0–75)
KETONE: NEGATIVE
Leukocyte Esterase: NEGATIVE
Nitrite: NEGATIVE
PH: 7 (ref 4.5–8.0)
PROTEIN: NEGATIVE
SPECIFIC GRAVITY: 1.002 (ref 1.003–1.030)

## 2014-06-20 LAB — BASIC METABOLIC PANEL
Anion Gap: 8 (ref 7–16)
BUN: 7 mg/dL
Calcium, Total: 9.7 mg/dL
Chloride: 107 mmol/L
Co2: 27 mmol/L
Creatinine: 0.62 mg/dL
GLUCOSE: 91 mg/dL
Potassium: 2.9 mmol/L — ABNORMAL LOW
Sodium: 142 mmol/L

## 2014-06-20 LAB — CBC
HCT: 42.9 % (ref 35.0–47.0)
HGB: 14.4 g/dL (ref 12.0–16.0)
MCH: 29.6 pg (ref 26.0–34.0)
MCHC: 33.6 g/dL (ref 32.0–36.0)
MCV: 88 fL (ref 80–100)
PLATELETS: 280 10*3/uL (ref 150–440)
RBC: 4.87 10*6/uL (ref 3.80–5.20)
RDW: 13.2 % (ref 11.5–14.5)
WBC: 12 10*3/uL — AB (ref 3.6–11.0)

## 2014-06-20 LAB — DRUG SCREEN, URINE
AMPHETAMINES, UR SCREEN: NEGATIVE
BENZODIAZEPINE, UR SCRN: NEGATIVE
Barbiturates, Ur Screen: NEGATIVE
CANNABINOID 50 NG, UR ~~LOC~~: NEGATIVE
COCAINE METABOLITE, UR ~~LOC~~: POSITIVE
MDMA (ECSTASY) UR SCREEN: NEGATIVE
METHADONE, UR SCREEN: NEGATIVE
OPIATE, UR SCREEN: POSITIVE
Phencyclidine (PCP) Ur S: NEGATIVE
Tricyclic, Ur Screen: NEGATIVE

## 2014-11-15 ENCOUNTER — Emergency Department
Admission: EM | Admit: 2014-11-15 | Discharge: 2014-11-15 | Disposition: A | Discharge status: Home / Self Care | Payer: Medicaid Other | Attending: Emergency Medicine | Admitting: Emergency Medicine

## 2014-11-15 ENCOUNTER — Encounter: Payer: Self-pay | Admitting: Medical Oncology

## 2014-11-15 ENCOUNTER — Emergency Department: Payer: Medicaid Other

## 2014-11-15 DIAGNOSIS — Z791 Long term (current) use of non-steroidal anti-inflammatories (NSAID): Secondary | ICD-10-CM | POA: Insufficient documentation

## 2014-11-15 DIAGNOSIS — Z79899 Other long term (current) drug therapy: Secondary | ICD-10-CM | POA: Insufficient documentation

## 2014-11-15 DIAGNOSIS — Z3A11 11 weeks gestation of pregnancy: Secondary | ICD-10-CM | POA: Insufficient documentation

## 2014-11-15 DIAGNOSIS — F17210 Nicotine dependence, cigarettes, uncomplicated: Secondary | ICD-10-CM | POA: Diagnosis not present

## 2014-11-15 DIAGNOSIS — O209 Hemorrhage in early pregnancy, unspecified: Secondary | ICD-10-CM

## 2014-11-15 DIAGNOSIS — Z792 Long term (current) use of antibiotics: Secondary | ICD-10-CM | POA: Insufficient documentation

## 2014-11-15 DIAGNOSIS — O99331 Smoking (tobacco) complicating pregnancy, first trimester: Secondary | ICD-10-CM | POA: Insufficient documentation

## 2014-11-15 DIAGNOSIS — N939 Abnormal uterine and vaginal bleeding, unspecified: Secondary | ICD-10-CM

## 2014-11-15 DIAGNOSIS — O2341 Unspecified infection of urinary tract in pregnancy, first trimester: Secondary | ICD-10-CM | POA: Diagnosis not present

## 2014-11-15 LAB — COMPREHENSIVE METABOLIC PANEL
ALBUMIN: 4.2 g/dL (ref 3.5–5.0)
ALK PHOS: 44 U/L (ref 38–126)
ALT: 12 U/L — AB (ref 14–54)
AST: 18 U/L (ref 15–41)
Anion gap: 6 (ref 5–15)
BUN: 6 mg/dL (ref 6–20)
CALCIUM: 8.6 mg/dL — AB (ref 8.9–10.3)
CO2: 23 mmol/L (ref 22–32)
Chloride: 102 mmol/L (ref 101–111)
Creatinine, Ser: 0.5 mg/dL (ref 0.44–1.00)
GFR calc Af Amer: >60 mL/min (ref >60)
GFR calc non Af Amer: >60 mL/min (ref >60)
GLUCOSE: 88 mg/dL (ref 65–99)
POTASSIUM: 3.4 mmol/L — AB (ref 3.5–5.1)
Sodium: 131 mmol/L — ABNORMAL LOW (ref 135–145)
Total Bilirubin: 0.3 mg/dL (ref 0.3–1.2)
Total Protein: 6.8 g/dL (ref 6.5–8.1)

## 2014-11-15 LAB — CBC
HEMATOCRIT: 35.8 % (ref 35.0–47.0)
Hemoglobin: 12.3 g/dL (ref 12.0–16.0)
MCH: 30.5 pg (ref 26.0–34.0)
MCHC: 34.3 g/dL (ref 32.0–36.0)
MCV: 88.9 fL (ref 80.0–100.0)
Platelets: 191 10*3/uL (ref 150–440)
RBC: 4.03 MIL/uL (ref 3.80–5.20)
RDW: 13.4 % (ref 11.5–14.5)
WBC: 11.2 10*3/uL — ABNORMAL HIGH (ref 3.6–11.0)

## 2014-11-15 LAB — WET PREP, GENITAL
Clue Cells Wet Prep HPF POC: NONE SEEN
Trich, Wet Prep: NONE SEEN
YEAST WET PREP: NONE SEEN

## 2014-11-15 LAB — URINALYSIS COMPLETE WITH MICROSCOPIC (ARMC ONLY)
BILIRUBIN URINE: NEGATIVE
Glucose, UA: NEGATIVE mg/dL
Nitrite: NEGATIVE
Protein, ur: 100 mg/dL — AB
Specific Gravity, Urine: 1.027 (ref 1.005–1.030)
TRANS EPITHEL UA: 3
pH: 6 (ref 5.0–8.0)

## 2014-11-15 LAB — ABO/RH: ABO/RH(D): A POS

## 2014-11-15 LAB — POCT PREGNANCY, URINE: PREG TEST UR: POSITIVE — AB

## 2014-11-15 LAB — HCG, QUANTITATIVE, PREGNANCY: hCG, Beta Chain, Quant, S: 60337 m[IU]/mL — ABNORMAL HIGH (ref <5)

## 2014-11-15 MED ORDER — NITROFURANTOIN MACROCRYSTAL 100 MG PO CAPS
ORAL_CAPSULE | ORAL | Status: AC
  Administered 2014-11-15: 22:00:00
  Filled 2014-11-15: qty 1

## 2014-11-15 MED ORDER — NITROFURANTOIN MONOHYD MACRO 100 MG PO CAPS: 100.0000 mg | ORAL_CAPSULE | Freq: Two times a day (BID) | ORAL | Status: DC

## 2014-11-15 MED ORDER — NITROFURANTOIN MONOHYD MACRO 100 MG PO CAPS
100.0000 mg | ORAL_CAPSULE | Freq: Once | ORAL | Status: AC
Start: 2014-11-15 — End: 2014-11-15
  Administered 2014-11-15: 100 mg via ORAL
  Filled 2014-11-15: qty 1

## 2014-11-15 NOTE — ED Notes (Signed)
MD at bedside. 

## 2014-11-15 NOTE — ED Notes (Signed)
Patient transported to [facility] 

## 2014-11-15 NOTE — ED Notes (Signed)
Pt reports she is approx [redacted] weeks pregnant and today she began having lower abd pain and vaginal bleeding.

## 2014-11-15 NOTE — Discharge Instructions (Signed)
Return to the emergency department for any new or worsening condition including heavy vaginal bleeding, dizziness, passing out, chest pain, no worsening abdominal pain, inability to urinate, fever, or any other symptoms concerning to you.  Follow-up with OB/GYN to follow along the pregnancy. Any bleeding in early pregnancy could indicate an early miscarriage, however most the time pregnancy progresses normally. You're being treated for urinary tract infection with Macrobid antibiotic.   Pregnancy and Urinary Tract Infection A urinary tract infection (UTI) is a bacterial infection of the urinary tract. Infection of the urinary tract can include the ureters, kidneys (pyelonephritis), bladder (cystitis), and urethra (urethritis). All pregnant women should be screened for bacteria in the urinary tract. Identifying and treating a UTI will decrease the risk of preterm labor and developing more serious infections in both the mother and baby. CAUSES Bacteria germs cause almost all UTIs.  RISK FACTORS Many factors can increase your chances of getting a UTI during pregnancy. These include:  Having a short urethra.  Poor toilet and hygiene habits.  Sexual intercourse.  Blockage of urine along the urinary tract.  Problems with the pelvic muscles or nerves.  Diabetes.  Obesity.  Bladder problems after having several children.  Previous history of UTI. SIGNS AND SYMPTOMS   Pain, burning, or a stinging feeling when urinating.  Suddenly feeling the need to urinate right away (urgency).  Loss of bladder control (urinary incontinence).  Frequent urination, more than is common with pregnancy.  Lower abdominal or back discomfort.  Cloudy urine.  Blood in the urine (hematuria).  Fever. When the kidneys are infected, the symptoms may be:  Back pain.  Flank pain on the right side more so than the left.  Fever.  Chills.  Nausea.  Vomiting. DIAGNOSIS  A urinary tract infection is  usually diagnosed through urine tests. Additional tests and procedures are sometimes done. These may include:  Ultrasound exam of the kidneys, ureters, bladder, and urethra.  Looking in the bladder with a lighted tube (cystoscopy). TREATMENT Typically, UTIs can be treated with antibiotic medicines.  HOME CARE INSTRUCTIONS   Only take over-the-counter or prescription medicines as directed by your health care provider. If you were prescribed antibiotics, take them as directed. Finish them even if you start to feel better.  Drink enough fluids to keep your urine clear or pale yellow.  Do not have sexual intercourse until the infection is gone and your health care provider says it is okay.  Make sure you are tested for UTIs throughout your pregnancy. These infections often come back. Preventing a UTI in the Future  Practice good toilet habits. Always wipe from front to back. Use the tissue only once.  Do not hold your urine. Empty your bladder as soon as possible when the urge comes.  Do not douche or use deodorant sprays.  Wash with soap and warm water around the genital area and the anus.  Empty your bladder before and after sexual intercourse.  Wear underwear with a cotton crotch.  Avoid caffeine and carbonated drinks. They can irritate the bladder.  Drink cranberry juice or take cranberry pills. This may decrease the risk of getting a UTI.  Do not drink alcohol.  Keep all your appointments and tests as scheduled. SEEK MEDICAL CARE IF:   Your symptoms get worse.  You are still having fevers 2 or more days after treatment begins.  You have a rash.  You feel that you are having problems with medicines prescribed.  You have abnormal vaginal  discharge. SEEK IMMEDIATE MEDICAL CARE IF:   You have back or flank pain.  You have chills.  You have blood in your urine.  You have nausea and vomiting.  You have contractions of your uterus.  You have a gush of fluid  from the vagina. MAKE SURE YOU:  Understand these instructions.   Will watch your condition.   Will get help right away if you are not doing well or get worse.  Document Released: 06/23/2010 Document Revised: 12/17/2012 Document Reviewed: 09/25/2012 [facility] Patient Information 2015 [place], [place]. This information is not intended to replace advice given to you by your health care provider. Make sure you discuss any questions you have with your health care provider.   Threatened Miscarriage A threatened miscarriage is when you have vaginal bleeding during your first 20 weeks of pregnancy but the pregnancy has not ended. Your doctor will do tests to make sure you are still pregnant. The cause of the bleeding may not be known. This condition does not mean your pregnancy will end. It does increase the risk of it ending (complete miscarriage). HOME CARE   Make sure you keep all your doctor visits for prenatal care.  Get plenty of rest.  Do not have sex or use tampons if you have vaginal bleeding.  Do not douche.  Do not smoke or use drugs.  Do not drink alcohol.  Avoid caffeine. GET HELP IF:  You have light bleeding from your vagina.  You have belly pain or cramping.  You have a fever. GET HELP RIGHT AWAY IF:   You have heavy bleeding from your vagina.  You have clots of blood coming from your vagina.  You have bad pain or cramps in your low back or belly.  You have fever, chills, and bad belly pain. MAKE SURE YOU:   Understand these instructions.  Will watch your condition.  Will get help right away if you are not doing well or get worse. Document Released: 02/09/2008 Document Revised: 03/03/2013 Document Reviewed: 12/23/2012 [facility] Patient Information 2015 [place], [place]. This information is not intended to replace advice given to you by your health care provider. Make sure you discuss any questions you have with your health care provider.

## 2014-11-15 NOTE — ED Provider Notes (Signed)
[facility] Emergency Department Provider Note   ____________________________________________  Time seen: 9 PM I have reviewed the triage vital signs and the triage nursing note.  HISTORY  Chief Complaint Abdominal Pain and Vaginal Bleeding   Historian Patient  HPI SACRED ROA is a 29 y.o. female is coming in for evaluation due to vaginal spotting noticed today. She's noticed small amount of pink or red on the tissue paper when she wiped. She is approximately [redacted] weeks pregnant and has not had a OB/GYN appointment or an ultrasound yet. She's had a little bit of lower abdominal cramping. She's had urinary frequency and dysuria. She's had some white vaginal discharge. She has been treated for gonorrhea and Chlamydia in the past. No fever. No nausea.     Past Medical History  Diagnosis Date  . Heart murmur   . Other and unspecified ovarian cysts 8/14  . Smoker 2014    There are no active problems to display for this patient.   History reviewed. No pertinent past surgical history.  Current Outpatient Rx  Name  Route  Sig  Dispense  Refill  . acetaminophen (TYLENOL) 500 MG tablet   Oral   Take 1,000 mg by mouth every 6 (six) hours as needed for moderate pain or fever.         Marland Kitchen amoxicillin (AMOXIL) 500 MG capsule   Oral   Take 1 capsule (500 mg total) by mouth 3 (three) times daily. For 10 days   30 capsule   0   . azithromycin (ZITHROMAX) 250 MG tablet   Oral   Take 1 tablet (250 mg total) by mouth daily. Take first 2 tablets together, then 1 every day until finished.   6 tablet   0   . HYDROcodone-acetaminophen (NORCO/VICODIN) 5-325 MG per tablet   Oral   Take 1 tablet by mouth every 6 (six) hours as needed.   15 tablet   0     For cough and pain   . ibuprofen (ADVIL,MOTRIN) 600 MG tablet   Oral   Take 1 tablet (600 mg total) by mouth 3 (three) times daily. Take with food   20 tablet   0   . nitrofurantoin,  macrocrystal-monohydrate, (MACROBID) 100 MG capsule   Oral   Take 1 capsule (100 mg total) by mouth 2 (two) times daily.   14 capsule   0   . predniSONE (DELTASONE) 10 MG tablet   Oral   Take 2 tablets (20 mg total) by mouth 2 (two) times daily with a meal.   16 tablet   0     Allergies Benadryl; Clindamycin/lincomycin; Codeine; and Tramadol  No family history on file.  Social History Social History  Substance Use Topics  . Smoking status: Current Every Day Smoker -- 0.50 packs/day    Types: Cigarettes  . Smokeless tobacco: None  . Alcohol Use: No    Review of Systems  Constitutional: Negative for fever. Eyes: Negative for visual changes. ENT: Negative for sore throat. Cardiovascular: Negative for chest pain. Respiratory: Negative for shortness of breath. Gastrointestinal: Negative for  vomiting and diarrhea. Genitourinary: No hematuria Musculoskeletal: Negative for back pain. Skin: Negative for rash. Neurological: Negative for headache. 10 point Review of Systems otherwise negative ____________________________________________   PHYSICAL EXAM:  VITAL SIGNS: ED Triage Vitals  Enc Vitals Group     BP 11/15/14 1853 116/52 mmHg     Pulse Rate 11/15/14 1853 82     Resp 11/15/14 1853  18     Temp 11/15/14 1853 97.8 F (36.6 C)     Temp Source 11/15/14 1853 Oral     SpO2 11/15/14 1853 98 %     Weight 11/15/14 1853 98 lb (44.453 kg)     Height 11/15/14 1853 4\' 11"  (1.499 m)     Head Cir --      Peak Flow --      Pain Score 11/15/14 1854 4     Pain Loc --      Pain Edu? --      Excl. in GC? --      Constitutional: Alert and oriented. Well appearing and in no distress. Eyes: Conjunctivae are normal. PERRL. Normal extraocular movements. ENT   Head: Normocephalic and atraumatic.   Nose: No congestion/rhinnorhea.   Mouth/Throat: Mucous membranes are moist.   Neck: No stridor. Cardiovascular/Chest: Normal rate, regular rhythm.  No murmurs, rubs,  or gallops. Respiratory: Normal respiratory effort without tachypnea nor retractions. Breath sounds are clear and equal bilaterally. No wheezes/rales/rhonchi. Gastrointestinal: Soft. No distention, no guarding, no rebound. Nontender   Genitourinary/rectal: Small amount of white vaginal discharge. No cervicitis or pelvic mass on bimanual exam. No vaginal bleeding. Musculoskeletal: Nontender with normal range of motion in all extremities. No joint effusions.  No lower extremity tenderness.  No edema. Neurologic:  Normal speech and language. No gross or focal neurologic deficits are appreciated. Skin:  Skin is warm, dry and intact. No rash noted. Psychiatric: Mood and affect are normal. Speech and behavior are normal. Patient exhibits appropriate insight and judgment.  ____________________________________________   EKG I, Governor Rooks, MD, the attending physician have personally viewed and interpreted all ECGs.  No EKG performed ____________________________________________  LABS (pertinent positives/negatives)  Pregnancy test positive Conference of metabolic panel significant for sodium 131, potassium 3.4 and otherwise without significant abnormalities White blood cell count 11.2, hemoglobin 12.3, platelet count 191 Beta hCG 60337 Urinalysis trace ketones, negative nitrites, 2+ leukocytes, too numerous to count red blood cells, too numerous to count white blood cells, rare bacteria Wet prep: Positive for white blood cells and otherwise negative Rh A+_ ___________________________________________  RADIOLOGY All Xrays were viewed by me. Imaging interpreted by Radiologist.  Transvaginal ultrasound: IMPRESSION: Single intrauterine pregnancy. Estimated gestational age by crown-rump length is 11 weeks 0 days. No acute complications are Suggested. Fetal heart rate 160 bpm __________________________________________  PROCEDURES  Procedure(s) performed: None  Critical Care performed:  None  ____________________________________________   ED COURSE / ASSESSMENT AND PLAN  CONSULTATIONS: None  Pertinent labs & imaging results that were available during my care of the patient were reviewed by me and considered in my medical decision making (see chart for details).  Patient is overall well-appearing, however she has not had imaging with this pregnancy. Her vaginal exam is reassuring with no sign of cervicitis, or bleeding.  Urine shows positive for urinary tract infection, and patient will be treated with Macrobid.  Ultrasound shows single IUP consistent with 11 weeks.   Patient / Family / Caregiver informed of clinical course, medical decision-making process, and agree with plan.   I discussed return precautions, follow-up instructions, and discharged instructions with patient and/or family.  ___________________________________________   FINAL CLINICAL IMPRESSION(S) / ED DIAGNOSES   Final diagnoses:  Vaginal bleeding  First trimester bleeding  Urinary tract infection during pregnancy in first trimester       Governor Rooks, MD 11/15/14 2233

## 2014-11-16 LAB — CHLAMYDIA/NGC RT PCR (ARMC ONLY)
Chlamydia Tr: DETECTED — AB
N gonorrhoeae: NOT DETECTED

## 2014-11-17 ENCOUNTER — Telehealth: Payer: Self-pay | Admitting: Emergency Medicine

## 2014-11-18 NOTE — ED Notes (Signed)
Pt called me back after i left her a message this am.  She was informed of positive chlamydia test, need for partner treatment and available free tx at [facility].  Azithromycin 1 gram po x 1 was called in to [facility] [place] for patient.  Patient says she has plans for prenatal care at [facility].

## 2014-11-20 LAB — URINE CULTURE

## 2014-12-28 ENCOUNTER — Other Ambulatory Visit: Payer: Self-pay | Admitting: Advanced Practice Midwife

## 2014-12-28 DIAGNOSIS — F1920 Other psychoactive substance dependence, uncomplicated: Secondary | ICD-10-CM

## 2014-12-28 DIAGNOSIS — F17200 Nicotine dependence, unspecified, uncomplicated: Secondary | ICD-10-CM

## 2014-12-28 LAB — HM HIV SCREENING LAB: HM HIV Screening: NEGATIVE

## 2014-12-28 LAB — HM PAP SMEAR: HM Pap smear: NEGATIVE

## 2015-01-06 ENCOUNTER — Ambulatory Visit: Payer: Medicaid Other

## 2015-01-13 ENCOUNTER — Ambulatory Visit (HOSPITAL_BASED_OUTPATIENT_CLINIC_OR_DEPARTMENT_OTHER)
Admission: RE | Admit: 2015-01-13 | Discharge: 2015-01-13 | Disposition: A | Discharge status: Home / Self Care | Payer: Medicaid Other | Source: Ambulatory Visit | Attending: Advanced Practice Midwife | Admitting: Advanced Practice Midwife

## 2015-01-13 ENCOUNTER — Ambulatory Visit
Admission: RE | Admit: 2015-01-13 | Discharge: 2015-01-13 | Disposition: A | Discharge status: Home / Self Care | Payer: Medicaid Other | Source: Ambulatory Visit | Attending: Advanced Practice Midwife | Admitting: Advanced Practice Midwife

## 2015-01-13 DIAGNOSIS — F1120 Opioid dependence, uncomplicated: Secondary | ICD-10-CM | POA: Insufficient documentation

## 2015-01-13 DIAGNOSIS — F17200 Nicotine dependence, unspecified, uncomplicated: Secondary | ICD-10-CM

## 2015-01-13 DIAGNOSIS — O99332 Smoking (tobacco) complicating pregnancy, second trimester: Secondary | ICD-10-CM | POA: Diagnosis not present

## 2015-01-13 DIAGNOSIS — Z87898 Personal history of other specified conditions: Secondary | ICD-10-CM

## 2015-01-13 DIAGNOSIS — O99320 Drug use complicating pregnancy, unspecified trimester: Secondary | ICD-10-CM

## 2015-01-13 DIAGNOSIS — F17210 Nicotine dependence, cigarettes, uncomplicated: Secondary | ICD-10-CM | POA: Diagnosis not present

## 2015-01-13 DIAGNOSIS — F1121 Opioid dependence, in remission: Secondary | ICD-10-CM | POA: Insufficient documentation

## 2015-01-13 DIAGNOSIS — F1920 Other psychoactive substance dependence, uncomplicated: Secondary | ICD-10-CM

## 2015-01-13 DIAGNOSIS — O365920 Maternal care for other known or suspected poor fetal growth, second trimester, not applicable or unspecified: Secondary | ICD-10-CM | POA: Diagnosis not present

## 2015-01-13 DIAGNOSIS — O99322 Drug use complicating pregnancy, second trimester: Secondary | ICD-10-CM | POA: Diagnosis not present

## 2015-01-13 DIAGNOSIS — F17290 Nicotine dependence, other tobacco product, uncomplicated: Secondary | ICD-10-CM

## 2015-01-13 DIAGNOSIS — Z3A19 19 weeks gestation of pregnancy: Secondary | ICD-10-CM | POA: Insufficient documentation

## 2015-01-13 DIAGNOSIS — O99330 Smoking (tobacco) complicating pregnancy, unspecified trimester: Secondary | ICD-10-CM | POA: Diagnosis not present

## 2015-01-13 DIAGNOSIS — Z36 Encounter for antenatal screening of mother: Secondary | ICD-10-CM | POA: Diagnosis present

## 2015-01-13 DIAGNOSIS — F1910 Other psychoactive substance abuse, uncomplicated: Secondary | ICD-10-CM

## 2015-01-13 NOTE — Progress Notes (Signed)
[facility] Maternal-Fetal [facility] Consultation   Chief Complaint: Advice regarding opioid use during pregnancy.  HPI: Ms. Laura Vazquez is a 29 y.o. G3P2004 at 7054w3d by 11wk US on 9Bobetta Lime/07/2014  who presents in consultation from [facility] for opioid use in pregnancy.  Past Medical History: Patient  has a past medical history of ovarian cysts (8/14); and Smoker (2014).  Past Surgical History: She has had teeth pulled. Obstetric History:  OB History    Gravida Para Term Preterm AB TAB SAB Ectopic Multiple Living   3 2 2       4     2008 NVD at 6340 weeks, female, 5#12oz 2009 NVD at 8138 weeks, female, 5#7oz   Gynecologic History:  Patient's last menstrual period was 09/05/2014 (approximate).  Problems with menses: no Hx of abnormal pap smears: yes, s/p colposcopy (per patient) Chlamydia in October 2016, HSV (in chart)  Medications:  PNVs Percocet (buys on the street) up to 6-10/day, 5-10mg  Allergies: Patient is allergic to benadryl; clindamycin/lincomycin; codeine; and tramadol.  Social History: Patient  reports that she has been smoking Cigarettes.  She has a 7 pack-year smoking history. She does not have any smokeless tobacco history on file. She reports that she uses illicit drugs (percocet). She reports that she does not drink alcohol.  Family History: family history includes COPD and ovarian cancer in her mother; Diabetes and thyroid disease in her father; brother with epilepsy.  Denies family hx of heart disease, stroke, mental retardation or birth defects Review of Systems A full 12 point review of systems was negative or as noted in the History of Present Illness.  Physical Exam: LMP 09/05/2014 (Approximate)  BP 124/64 P 77 Weight 99#    Asessement: 1. History of poor fetal growth   2. Tobacco use affecting pregnancy, antepartum   3. Substance abuse affecting pregnancy, antepartum     Plan: 29yo [id number] at 6054w3d with opioid abuse and smoking history. 1.  Hx of fetal growth  restriction.  We talked extensively about tobacco smoking and it's association with FGR.  Smoking has also been associated with an increased risk of stillbirth, abruption, and SIDs as well as childhood asthma.  Her partner smokes 2-3 pp/d and is unwilling to quit ("unless I'm in a coffin").  Discussed the use of smoking cessation aids during pregnancy as well.  Even cutting down can improve fetal outcomes.  Recommend serial US for growth in the 3rd trimester.  Note that based on an early US, there is already an 8 day growth lag.  Ms. Laura Vazquez was offered and declined aneuploidy screening.  Will schedule f/up US at about 26 weeks. 2. Tobacco use - see above 3. Opioid abuse.  Ms. Laura Vazquez is interested in quitting.  She has tried opioid maintenance medications previously but didn't like how they made her feel.  Discussed issues with buying medications from unreliable sources as well as her positive cocaine tox screen in April.  She has been referred to [facility] and has an appointment there next week.  We discussed that buying medications on the street increases the risk for tampering, can impact her own health (as it may increase her risk for using other substances or taking less care of herself).  We also discussed the risk for neonatal abstinence.  She admits to using opioids for the past 2 years so prior pregnancies have not been affected.  We discussed that neonatal withdrawal can be life threatening and usually requires admission to the NICU for close surveillance and  monitoring and medication management.  She may benefit from a NICU consult in the third trimester to discuss what to expect postnatally.  Total time spent with the patient was 30 minutes with greater than 50% spent in counseling and coordination of care. We appreciate this interesting consult and will be happy to be involved in the ongoing care of Ms. Laura Vazquez in anyway her obstetricians desire.  Kirby Funk, MD Maternal-Fetal Medicine [facility]

## 2015-02-07 DIAGNOSIS — J45909 Unspecified asthma, uncomplicated: Secondary | ICD-10-CM | POA: Insufficient documentation

## 2015-03-03 ENCOUNTER — Ambulatory Visit: Payer: Medicaid Other

## 2015-11-18 ENCOUNTER — Encounter (HOSPITAL_COMMUNITY): Payer: Self-pay

## 2016-11-16 ENCOUNTER — Emergency Department
Admission: EM | Admit: 2016-11-16 | Discharge: 2016-11-16 | Disposition: A | Discharge status: Home / Self Care | Payer: Self-pay | Attending: Emergency Medicine | Admitting: Emergency Medicine

## 2016-11-16 ENCOUNTER — Encounter: Payer: Self-pay | Admitting: Emergency Medicine

## 2016-11-16 DIAGNOSIS — N939 Abnormal uterine and vaginal bleeding, unspecified: Secondary | ICD-10-CM | POA: Insufficient documentation

## 2016-11-16 LAB — POCT PREGNANCY, URINE: PREG TEST UR: NEGATIVE

## 2016-11-16 NOTE — ED Triage Notes (Signed)
Pt in via POV with complaints of vaginal bleeding and abdominal cramping x 2 days.  Pt reports having normal period approximately 2 weeks ago.  Ambulatory to triage.  Vitals WDL.  NAD noted at this time.

## 2016-11-16 NOTE — ED Notes (Signed)
Called patient  For second time from lobby to bring back to a room.  No response from patient and unable to locate patient at this time.

## 2016-11-16 NOTE — ED Notes (Signed)
Called patient from lobby to bring back to a room.  No response from patient and unable to locate patient at this time.

## 2016-11-16 NOTE — ED Notes (Signed)
Urine POC negative. 

## 2017-03-12 NOTE — L&D Delivery Note (Signed)
Delivery Note At 2:55 AM a viable female was delivered via Vaginal, Spontaneous (Presentation:vtx ;  ).  APGAR: 7, 9; weight  .   Placenta status:intact  , .  Cord:  with the following complications: .precipitous vaginal delivery     Mother progress from 4 to 7 to C/ C within 15 minutes . 1mg  stadol given 5 min before delivery to try to prevent mother from pushing through cervix. Nitrous not available ( broken). One push vigorous female delivered with meconium stained fluid. Peds in attendance . Delayed cord clamping , shortened to approx 40 seconds due to respiratory issues Anesthesia:   Episiotomy:  none Lacerations: first degree  Suture Repair: 3.0 vicryl Est. Blood Loss (mL): 150 cc   Mom to postpartum.  Baby to Couplet care / Skin to Skin.  Laura Vazquez Laura Vazquez 10/01/2017, 3:30 AM

## 2017-03-23 IMAGING — US US OB COMP LESS 14 WK
1 series · 14 of 28 positions shown · non-contrast
Comparison: None.

CLINICAL DATA: Vaginal bleeding and spotting for 1 day. LMP is
unknown. Quantitative beta HCG is [DATE].

EXAM:
OBSTETRIC <14 WK US AND TRANSVAGINAL OB US
TECHNIQUE: Both transabdominal and transvaginal ultrasound examinations were
performed for complete evaluation of the gestation as well as the
maternal uterus, adnexal regions, and pelvic cul-de-sac.
Transvaginal technique was performed to assess early pregnancy.

[Series 1: us ob comp less 14 wk · 0.20mm/px · 14 of 58 slices shown]
[im 3/58]
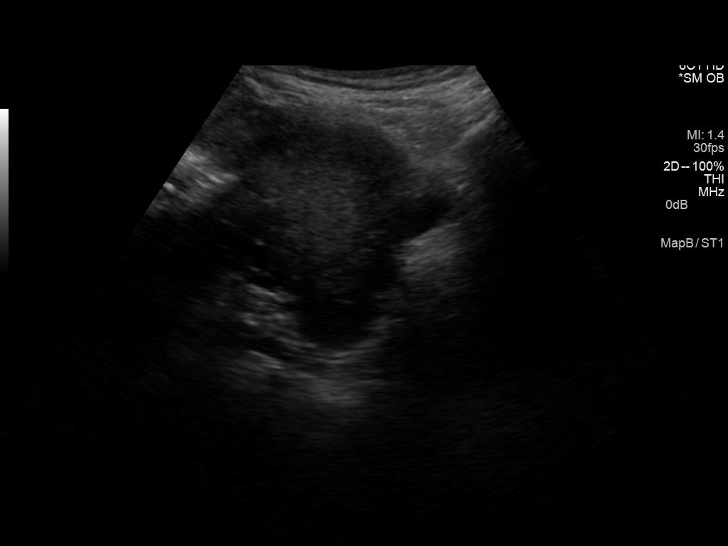
[im 7/58]
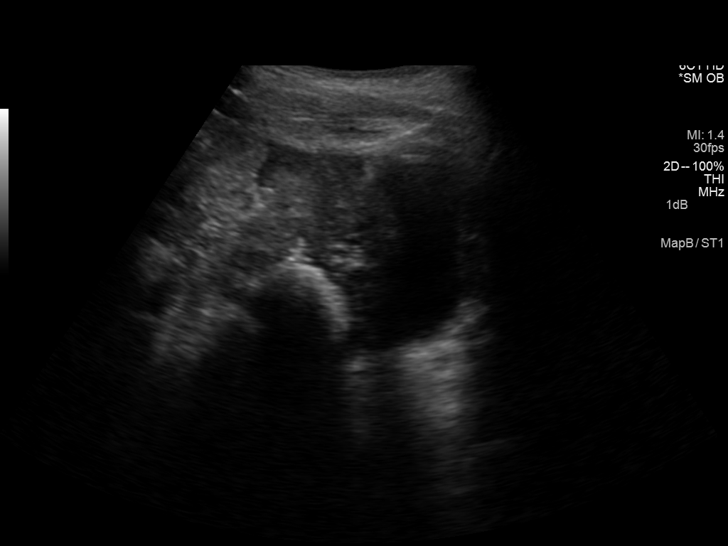
[im 11/58]
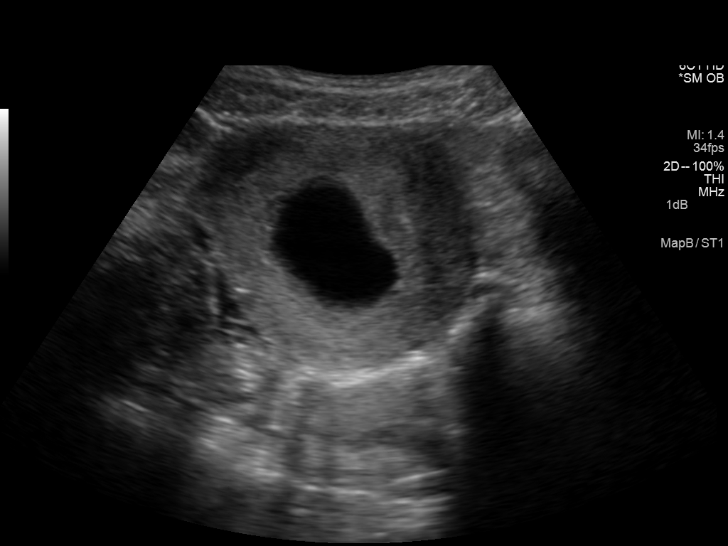
[im 15/58]
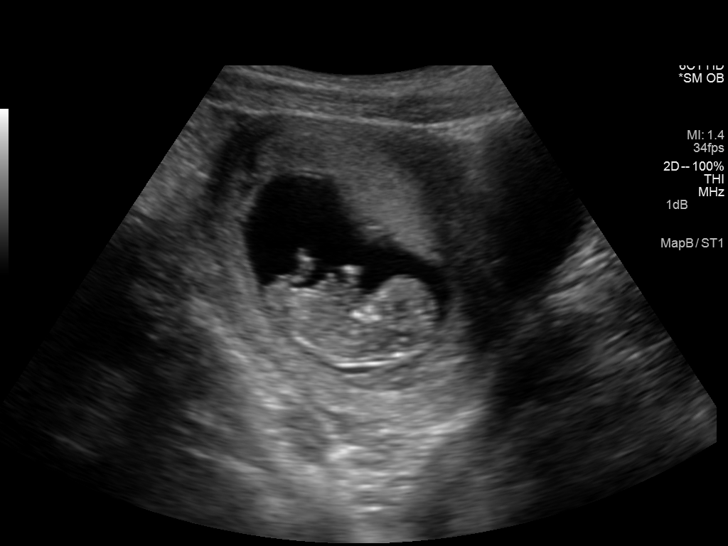
[im 20/58]
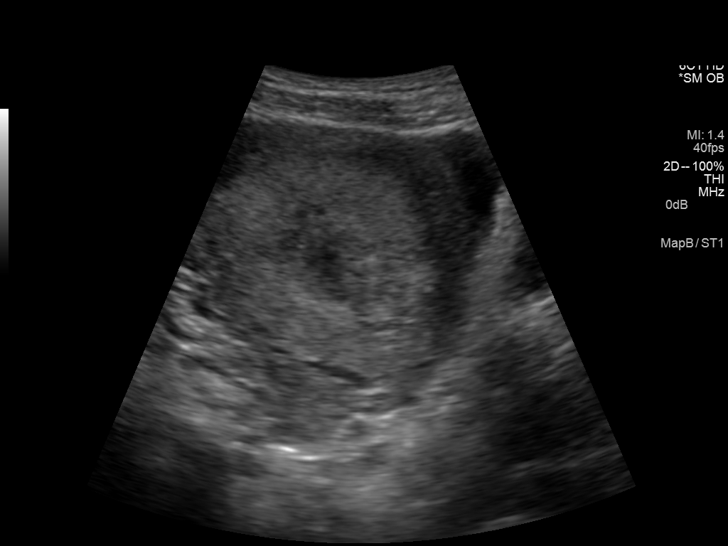
[im 24/58]
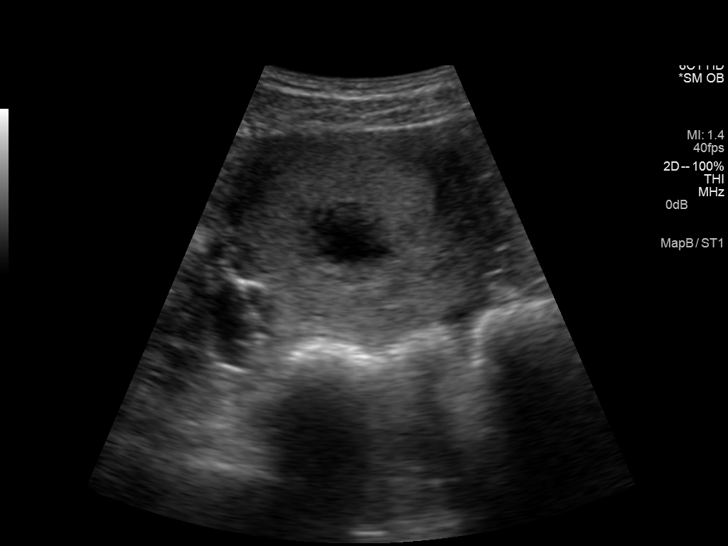
[im 28/58]
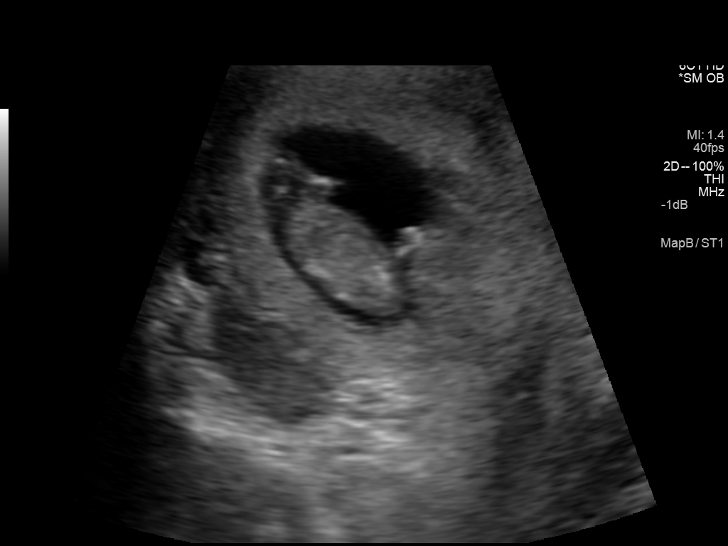
[im 32/58]
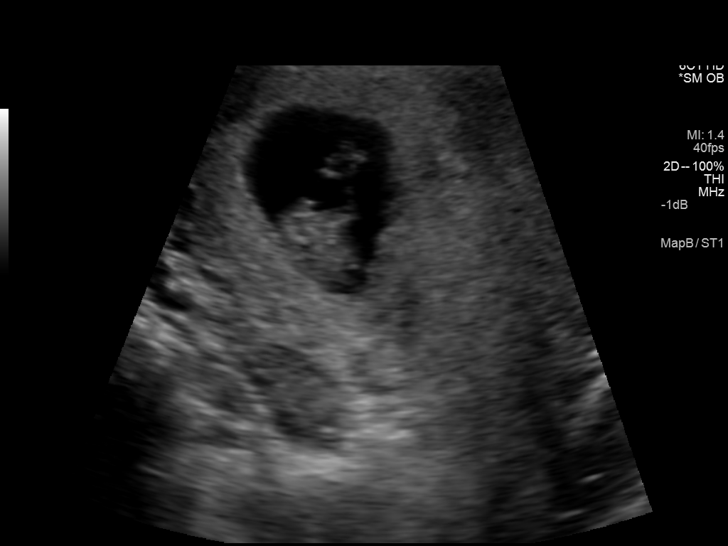
[im 36/58]
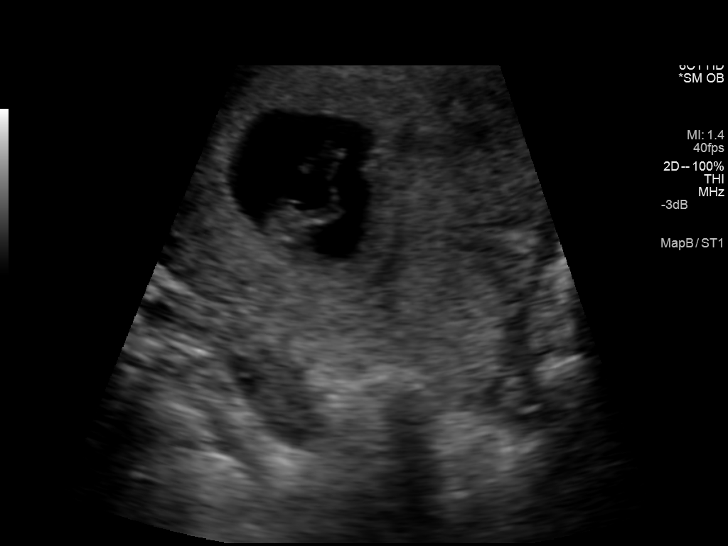
[im 41/58]
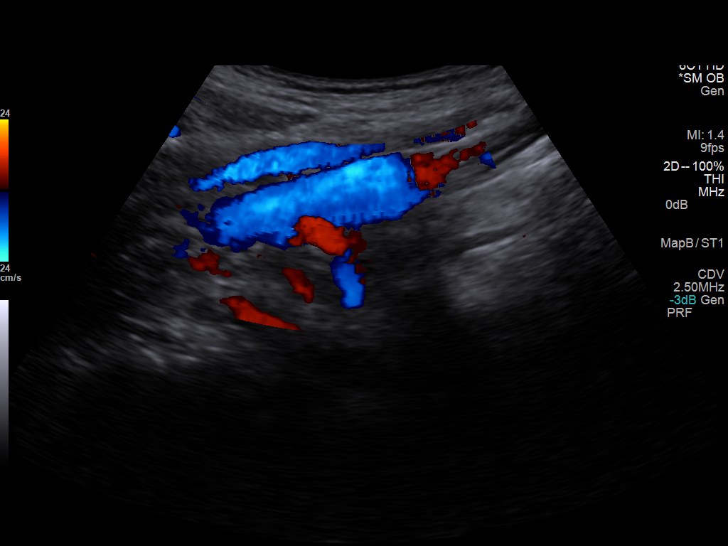
[im 45/58]
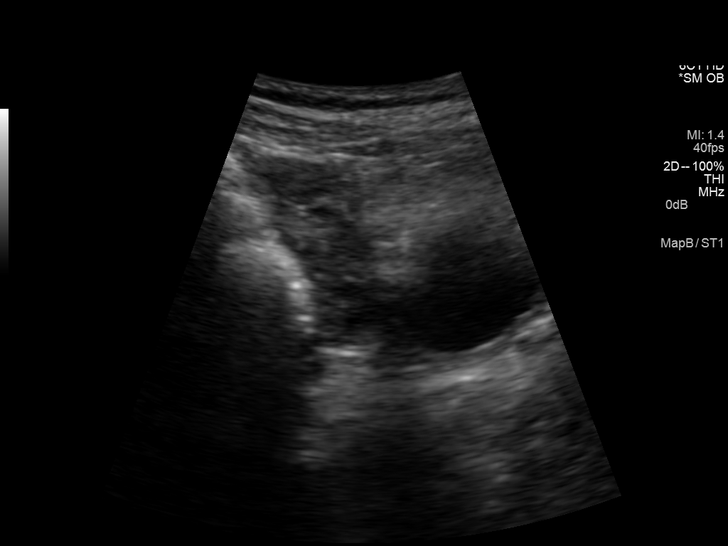
[im 49/58]
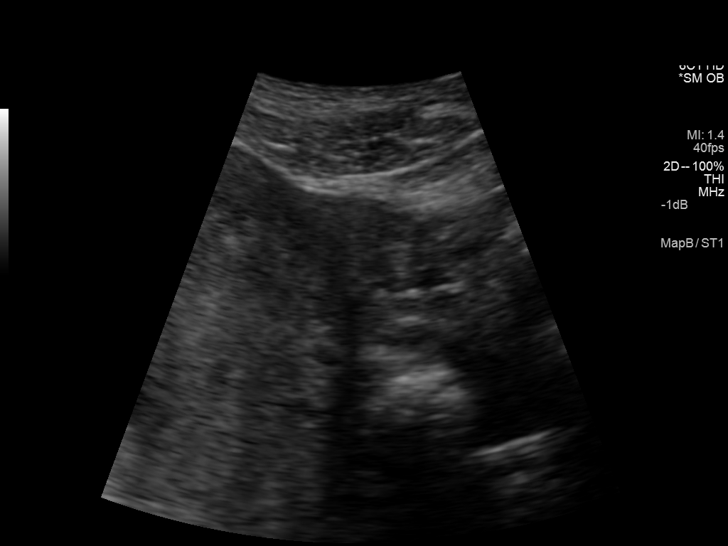
[im 53/58]
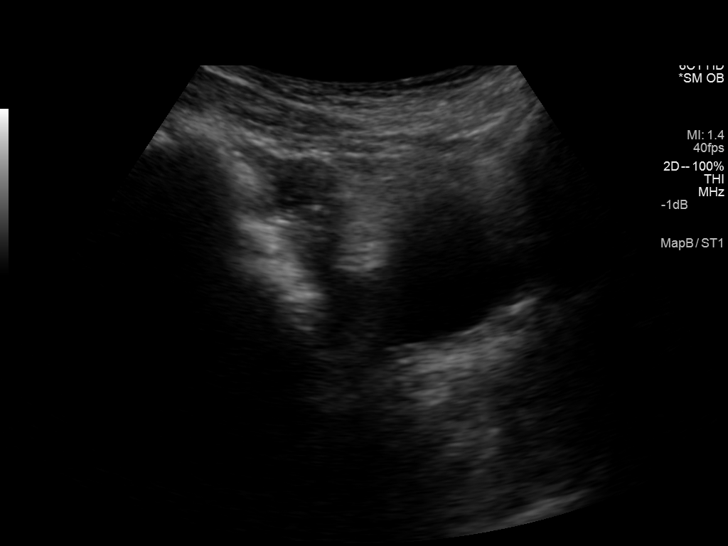
[im 58/58]
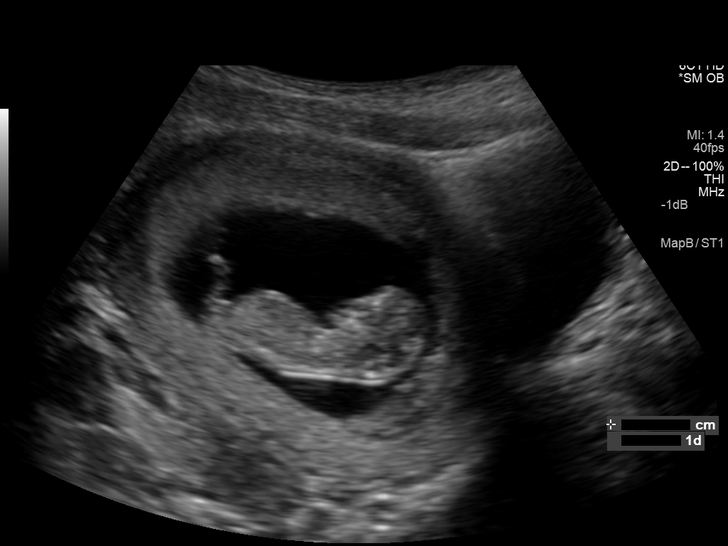

[14 of 28 positions shown; findings below may reference images not displayed]

FINDINGS: Intrauterine gestational sac: A single intrauterine pregnancy is
identified.

Yolk sac: Yolk sac is not identified, likely consistent with
gestational age.

Embryo:  Fetal pole is present.

Cardiac Activity: Fetal cardiac activity is observed.

Heart Rate: 162  bpm

CRL:  41  mm   11 w   0 d                  US EDC: 06/06/2015

Maternal uterus/adnexae: No focal myometrial lesions are identified.
No subchorionic hemorrhage. Limited visualization of the ovaries but
no abnormal adnexal mass lesions identified. No free pelvic fluid.
IMPRESSION: Single intrauterine pregnancy. Estimated gestational age by
crown-rump length is 11 weeks 0 days. No acute complications are
suggested.

## 2017-05-21 IMAGING — US US MFM OB DETAIL+14 WK
2 series · 14 of 28 positions shown · non-contrast
Comparison: none

[Series 1: us mfm ob detail+14 wk · 0.19mm/px · 13 of 76 slices shown (1 of 2)]
[im 3/76]
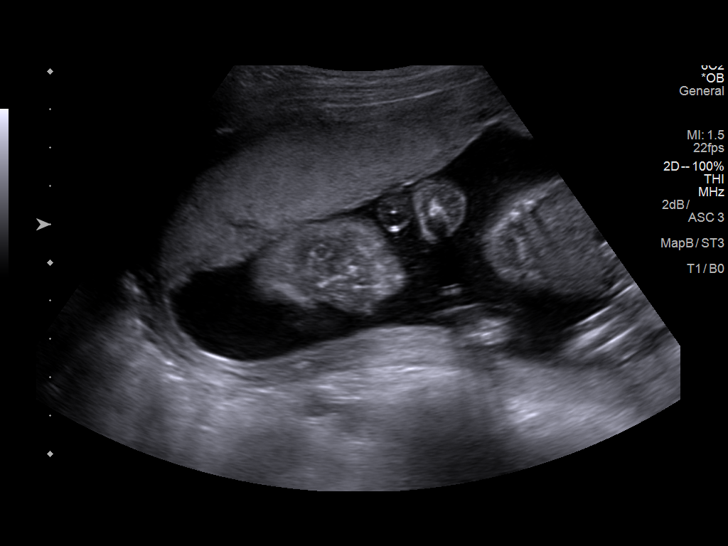
[im 9/76]
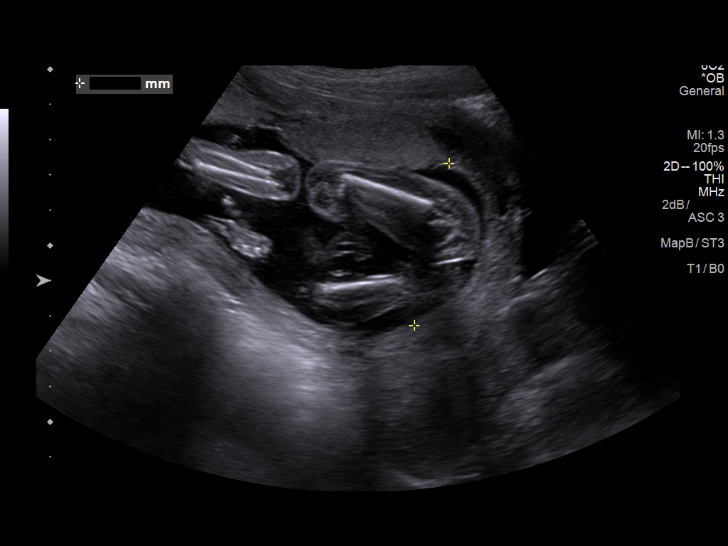
[im 15/76]
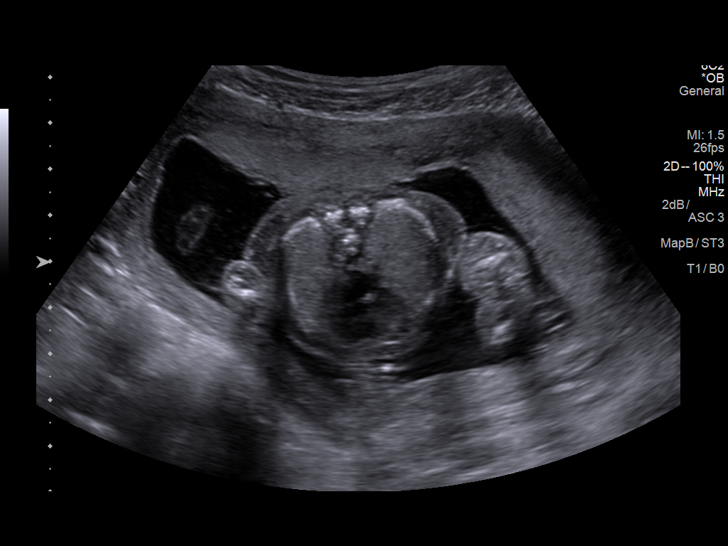
[im 21/76]
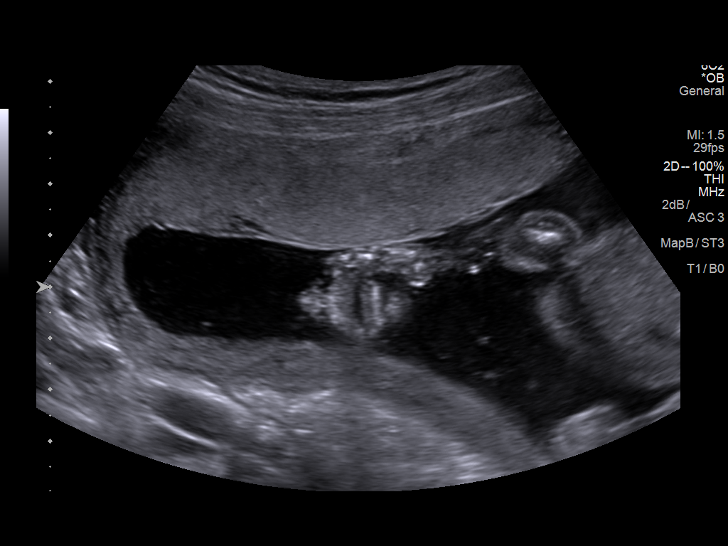
[im 26/76]
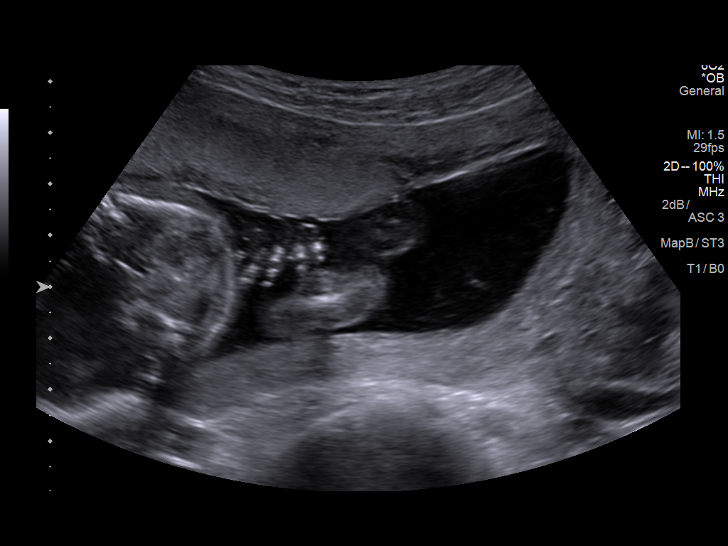
[im 32/76]
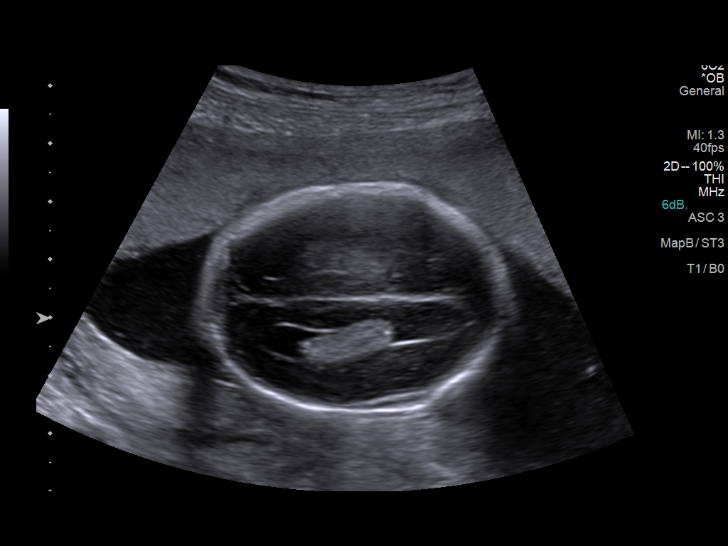
[im 38/76]
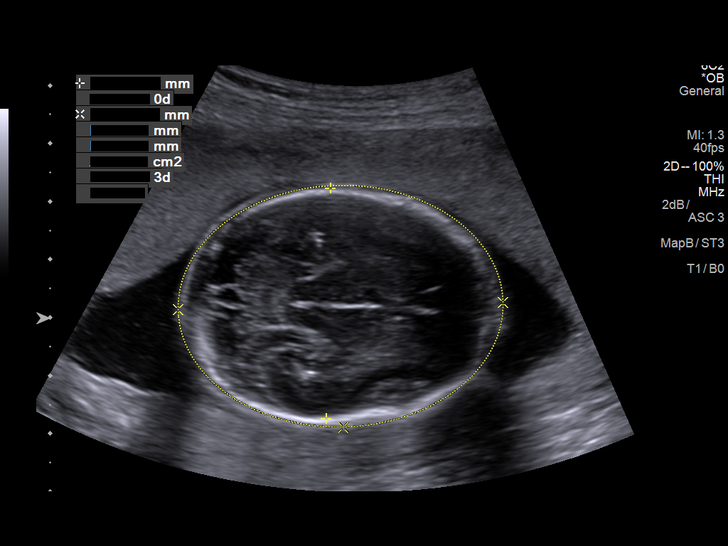
[im 44/76]
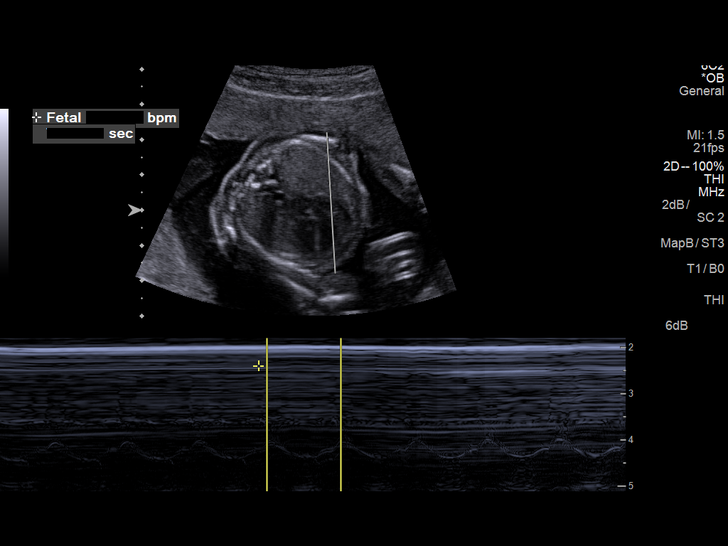
[im 50/76]
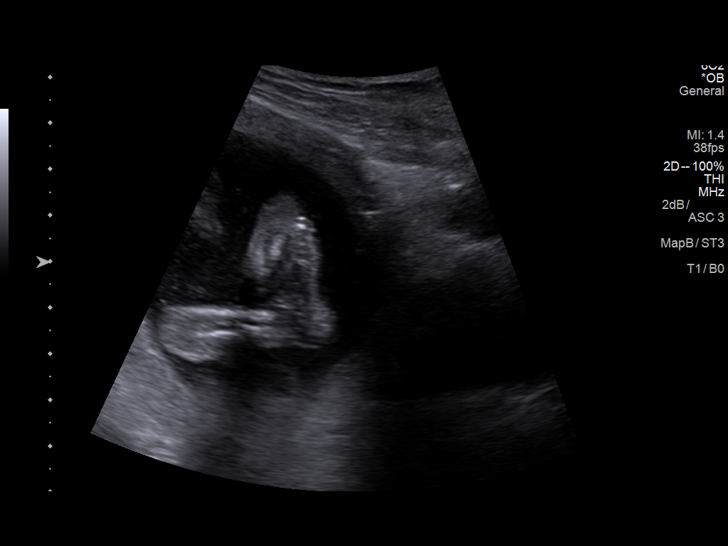
[im 55/76]
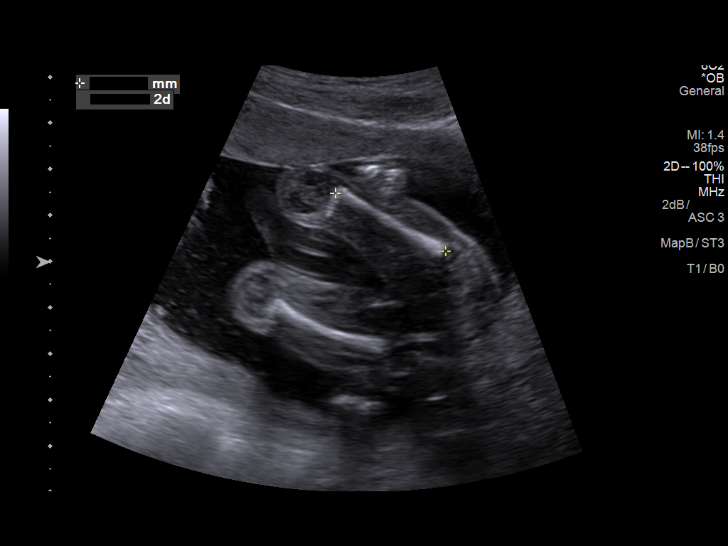
[im 61/76]
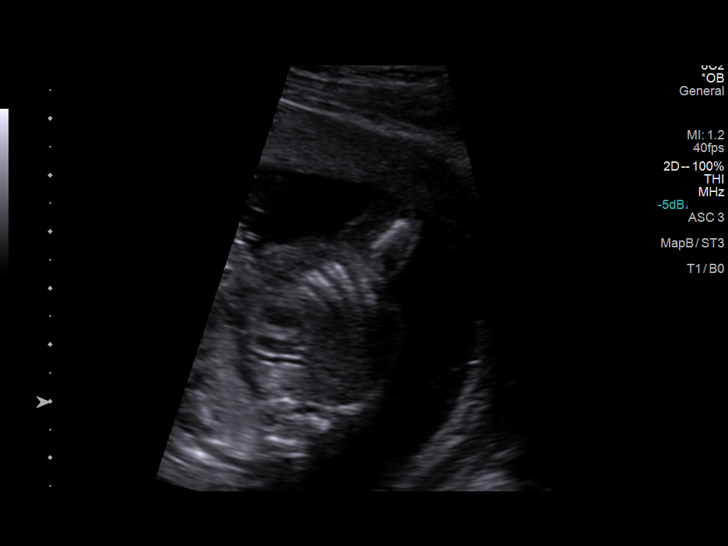
[im 67/76]
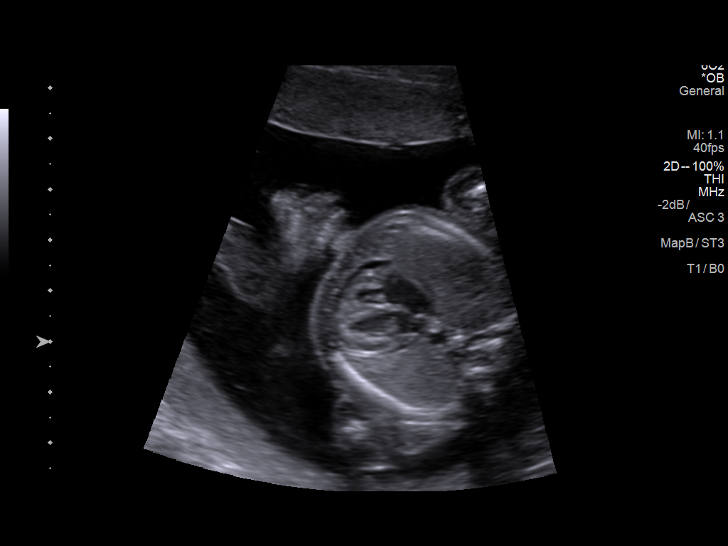
[im 73/76]
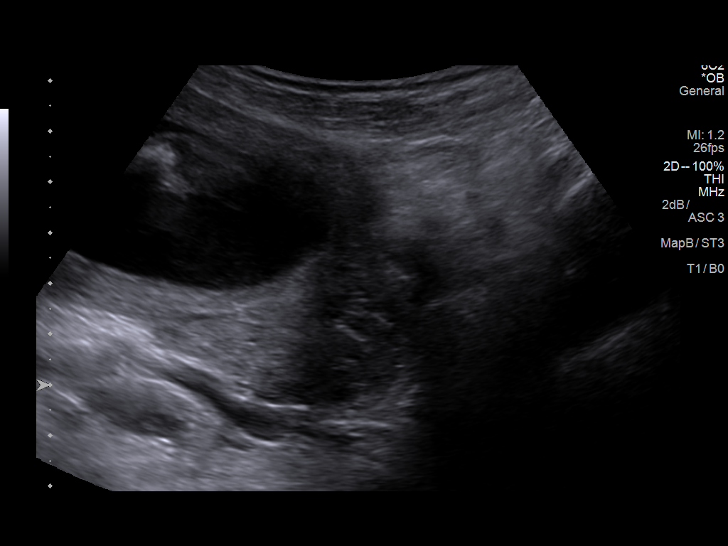

[Series 1001: us mfm ob detail+14 wk · 0.17mm/px · 1 of 1 slices shown (2 of 2)]
[im 1/1]
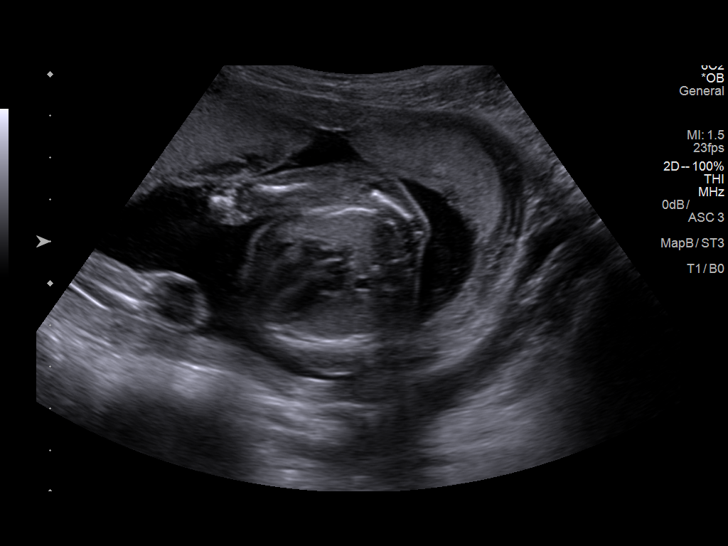

[14 of 28 positions shown; findings below may reference images not displayed]

Canned report from images found in remote index.

Refer to host system for actual result text.

## 2017-06-26 ENCOUNTER — Other Ambulatory Visit: Payer: Self-pay | Admitting: Family Medicine

## 2017-06-26 DIAGNOSIS — Z3482 Encounter for supervision of other normal pregnancy, second trimester: Secondary | ICD-10-CM

## 2017-07-03 ENCOUNTER — Ambulatory Visit
Admission: RE | Admit: 2017-07-03 | Discharge: 2017-07-03 | Disposition: A | Discharge status: Home / Self Care | Payer: Medicaid Other | Source: Ambulatory Visit | Attending: Family Medicine | Admitting: Family Medicine

## 2017-07-03 DIAGNOSIS — Z3482 Encounter for supervision of other normal pregnancy, second trimester: Secondary | ICD-10-CM | POA: Diagnosis present

## 2017-10-01 ENCOUNTER — Other Ambulatory Visit: Payer: Self-pay

## 2017-10-01 ENCOUNTER — Inpatient Hospital Stay
Admission: EM | Admit: 2017-10-01 | Discharge: 2017-10-02 | DRG: 805 | Disposition: A | Discharge status: Home / Self Care | Payer: Medicaid Other | Attending: Obstetrics and Gynecology | Admitting: Obstetrics and Gynecology

## 2017-10-01 DIAGNOSIS — O4700 False labor before 37 completed weeks of gestation, unspecified trimester: Secondary | ICD-10-CM | POA: Diagnosis present

## 2017-10-01 DIAGNOSIS — Z3A35 35 weeks gestation of pregnancy: Secondary | ICD-10-CM

## 2017-10-01 DIAGNOSIS — F17210 Nicotine dependence, cigarettes, uncomplicated: Secondary | ICD-10-CM | POA: Diagnosis present

## 2017-10-01 DIAGNOSIS — O99334 Smoking (tobacco) complicating childbirth: Secondary | ICD-10-CM | POA: Diagnosis present

## 2017-10-01 DIAGNOSIS — O479 False labor, unspecified: Secondary | ICD-10-CM | POA: Diagnosis present

## 2017-10-01 HISTORY — DX: Depression, unspecified: F32.A

## 2017-10-01 HISTORY — DX: Major depressive disorder, single episode, unspecified: F32.9

## 2017-10-01 LAB — URINE DRUG SCREEN, QUALITATIVE (ARMC ONLY)
AMPHETAMINES, UR SCREEN: POSITIVE — AB
AMPHETAMINES, UR SCREEN: POSITIVE — AB
BARBITURATES, UR SCREEN: NOT DETECTED
Barbiturates, Ur Screen: NOT DETECTED
Benzodiazepine, Ur Scrn: NOT DETECTED
Benzodiazepine, Ur Scrn: NOT DETECTED
CANNABINOID 50 NG, UR ~~LOC~~: NOT DETECTED
COCAINE METABOLITE, UR ~~LOC~~: NOT DETECTED
COCAINE METABOLITE, UR ~~LOC~~: NOT DETECTED
Cannabinoid 50 Ng, Ur ~~LOC~~: NOT DETECTED
MDMA (ECSTASY) UR SCREEN: NOT DETECTED
MDMA (ECSTASY) UR SCREEN: NOT DETECTED
METHADONE SCREEN, URINE: NOT DETECTED
Methadone Scn, Ur: NOT DETECTED
OPIATE, UR SCREEN: NOT DETECTED
Opiate, Ur Screen: NOT DETECTED
PHENCYCLIDINE (PCP) UR S: NOT DETECTED
Phencyclidine (PCP) Ur S: NOT DETECTED
TRICYCLIC, UR SCREEN: NOT DETECTED
Tricyclic, Ur Screen: NOT DETECTED

## 2017-10-01 LAB — DIFFERENTIAL
Basophils Absolute: 0 10*3/uL (ref 0–0.1)
Basophils Relative: 0 %
EOS PCT: 1 %
Eosinophils Absolute: 0.2 10*3/uL (ref 0–0.7)
LYMPHS ABS: 1.9 10*3/uL (ref 1.0–3.6)
Lymphocytes Relative: 11 %
MONO ABS: 1.1 10*3/uL — AB (ref 0.2–0.9)
Monocytes Relative: 6 %
Neutro Abs: 14.7 10*3/uL — ABNORMAL HIGH (ref 1.4–6.5)
Neutrophils Relative %: 82 %

## 2017-10-01 LAB — CBC
HCT: 35 % (ref 35.0–47.0)
HCT: 34.2 % — ABNORMAL LOW (ref 35.0–47.0)
HEMOGLOBIN: 12 g/dL (ref 12.0–16.0)
HEMOGLOBIN: 11.6 g/dL — AB (ref 12.0–16.0)
MCH: 27.6 pg (ref 26.0–34.0)
MCH: 27.2 pg (ref 26.0–34.0)
MCHC: 34.4 g/dL (ref 32.0–36.0)
MCHC: 33.9 g/dL (ref 32.0–36.0)
MCV: 80.4 fL (ref 80.0–100.0)
MCV: 80.2 fL (ref 80.0–100.0)
PLATELETS: 248 10*3/uL (ref 150–440)
PLATELETS: 240 10*3/uL (ref 150–440)
RBC: 4.35 MIL/uL (ref 3.80–5.20)
RBC: 4.26 MIL/uL (ref 3.80–5.20)
RDW: 14.1 % (ref 11.5–14.5)
RDW: 14.2 % (ref 11.5–14.5)
WBC: 17.8 10*3/uL — ABNORMAL HIGH (ref 3.6–11.0)
WBC: 18.4 10*3/uL — AB (ref 3.6–11.0)

## 2017-10-01 LAB — TYPE AND SCREEN
ABO/RH(D): A POS
Antibody Screen: NEGATIVE

## 2017-10-01 LAB — RAPID HIV SCREEN (HIV 1/2 AB+AG)
HIV 1/2 ANTIBODIES: NONREACTIVE
HIV-1 P24 ANTIGEN - HIV24: NONREACTIVE

## 2017-10-01 LAB — HEPATITIS B SURFACE ANTIGEN: Hepatitis B Surface Ag: NEGATIVE

## 2017-10-01 MED ORDER — ONDANSETRON HCL 4 MG/2ML IJ SOLN: 4.0000 mg | Freq: Four times a day (QID) | INTRAMUSCULAR | Status: DC | PRN

## 2017-10-01 MED ORDER — IBUPROFEN 600 MG PO TABS
600.0000 mg | ORAL_TABLET | Freq: Four times a day (QID) | ORAL | Status: DC
  Administered 2017-10-01: 600 mg via ORAL
  Filled 2017-10-01: qty 1

## 2017-10-01 MED ORDER — ACETAMINOPHEN 325 MG PO TABS: 650.0000 mg | ORAL_TABLET | ORAL | Status: DC | PRN

## 2017-10-01 MED ORDER — BENZOCAINE-MENTHOL 20-0.5 % EX AERO: 1.0000 "application " | INHALATION_SPRAY | CUTANEOUS | Status: DC | PRN

## 2017-10-01 MED ORDER — FERROUS SULFATE 325 (65 FE) MG PO TABS
325.0000 mg | ORAL_TABLET | Freq: Two times a day (BID) | ORAL | Status: DC
  Administered 2017-10-01 – 2017-10-02 (×3): 325 mg via ORAL
  Filled 2017-10-01 (×3): qty 1

## 2017-10-01 MED ORDER — LIDOCAINE HCL (PF) 1 % IJ SOLN
INTRAMUSCULAR | Status: AC
  Administered 2017-10-01: 30 mL via SUBCUTANEOUS
  Filled 2017-10-01: qty 30

## 2017-10-01 MED ORDER — OXYTOCIN 40 UNITS IN LACTATED RINGERS INFUSION - SIMPLE MED: 2.5000 [IU]/h | INTRAVENOUS | Status: DC

## 2017-10-01 MED ORDER — LIDOCAINE HCL (PF) 1 % IJ SOLN
30.0000 mL | INTRAMUSCULAR | Status: DC | PRN
  Administered 2017-10-01: 30 mL via SUBCUTANEOUS

## 2017-10-01 MED ORDER — ONDANSETRON HCL 4 MG/2ML IJ SOLN: 4.0000 mg | INTRAMUSCULAR | Status: DC | PRN

## 2017-10-01 MED ORDER — DIBUCAINE 1 % RE OINT: 1.0000 "application " | TOPICAL_OINTMENT | RECTAL | Status: DC | PRN

## 2017-10-01 MED ORDER — BUTORPHANOL TARTRATE 1 MG/ML IJ SOLN
1.0000 mg | INTRAMUSCULAR | Status: DC | PRN
  Administered 2017-10-01: 1 mg via INTRAVENOUS

## 2017-10-01 MED ORDER — BUTORPHANOL TARTRATE 2 MG/ML IJ SOLN
INTRAMUSCULAR | Status: AC
  Administered 2017-10-01: 1 mg via INTRAVENOUS
  Filled 2017-10-01: qty 1

## 2017-10-01 MED ORDER — PRENATAL MULTIVITAMIN CH
1.0000 | ORAL_TABLET | Freq: Every day | ORAL | Status: DC
  Administered 2017-10-01: 1 via ORAL
  Filled 2017-10-01: qty 1

## 2017-10-01 MED ORDER — SOD CITRATE-CITRIC ACID 500-334 MG/5ML PO SOLN: 30.0000 mL | ORAL | Status: DC | PRN

## 2017-10-01 MED ORDER — AMMONIA AROMATIC IN INHA
RESPIRATORY_TRACT | Status: AC
  Filled 2017-10-01: qty 10

## 2017-10-01 MED ORDER — SENNOSIDES-DOCUSATE SODIUM 8.6-50 MG PO TABS
2.0000 | ORAL_TABLET | ORAL | Status: DC
  Administered 2017-10-01: 2 via ORAL
  Filled 2017-10-01: qty 2

## 2017-10-01 MED ORDER — IBUPROFEN 600 MG PO TABS
600.0000 mg | ORAL_TABLET | Freq: Four times a day (QID) | ORAL | Status: DC
  Administered 2017-10-01 – 2017-10-02 (×5): 600 mg via ORAL
  Filled 2017-10-01 (×5): qty 1

## 2017-10-01 MED ORDER — OXYTOCIN BOLUS FROM INFUSION
500.0000 mL | Freq: Once | INTRAVENOUS | Status: AC
  Administered 2017-10-01: 500 mL via INTRAVENOUS

## 2017-10-01 MED ORDER — ONDANSETRON HCL 4 MG PO TABS: 4.0000 mg | ORAL_TABLET | ORAL | Status: DC | PRN

## 2017-10-01 MED ORDER — MISOPROSTOL 200 MCG PO TABS
ORAL_TABLET | ORAL | Status: AC
  Filled 2017-10-01: qty 4

## 2017-10-01 MED ORDER — SODIUM CHLORIDE 0.9 % IV SOLN
INTRAVENOUS | Status: AC
  Filled 2017-10-01: qty 2000

## 2017-10-01 MED ORDER — OXYTOCIN 40 UNITS IN LACTATED RINGERS INFUSION - SIMPLE MED
INTRAVENOUS | Status: AC
  Administered 2017-10-01: 500 mL via INTRAVENOUS
  Filled 2017-10-01: qty 1000

## 2017-10-01 MED ORDER — LACTATED RINGERS IV SOLN
INTRAVENOUS | Status: DC
  Administered 2017-10-01: 02:00:00 via INTRAVENOUS

## 2017-10-01 MED ORDER — LACTATED RINGERS IV SOLN: 500.0000 mL | INTRAVENOUS | Status: DC | PRN

## 2017-10-01 MED ORDER — COCONUT OIL OIL: 1.0000 "application " | TOPICAL_OIL | Status: DC | PRN

## 2017-10-01 MED ORDER — OXYTOCIN 10 UNIT/ML IJ SOLN
INTRAMUSCULAR | Status: AC
  Filled 2017-10-01: qty 2

## 2017-10-01 MED ORDER — WITCH HAZEL-GLYCERIN EX PADS: 1.0000 "application " | MEDICATED_PAD | CUTANEOUS | Status: DC | PRN

## 2017-10-01 MED ORDER — SODIUM CHLORIDE 0.9 % IV SOLN
2.0000 g | Freq: Once | INTRAVENOUS | Status: AC
  Administered 2017-10-01: 03:00:00 via INTRAVENOUS

## 2017-10-01 MED ORDER — MAGNESIUM HYDROXIDE 400 MG/5ML PO SUSP: 30.0000 mL | ORAL | Status: DC | PRN

## 2017-10-01 MED ORDER — SIMETHICONE 80 MG PO CHEW: 80.0000 mg | CHEWABLE_TABLET | ORAL | Status: DC | PRN

## 2017-10-01 MED ORDER — ZOLPIDEM TARTRATE 5 MG PO TABS: 5.0000 mg | ORAL_TABLET | Freq: Every evening | ORAL | Status: DC | PRN

## 2017-10-01 MED ORDER — MEASLES, MUMPS & RUBELLA VAC ~~LOC~~ INJ: 0.5000 mL | INJECTION | Freq: Once | SUBCUTANEOUS | Status: DC

## 2017-10-01 NOTE — Progress Notes (Signed)
Visited patient with LCSW Monica this afternoon. Patient could hardly not hold her eyes open and looked like she had taken something. Laura Vazquez CNM notified and stated we could place a UDS if patient agreed to provide urine. Will ask patient and place order if patient agrees.

## 2017-10-01 NOTE — H&P (Signed)
Laura Vazquez is a 32 y.o. female presenting foractive contractions . Poor prenatal care . 35+2 based on 22+6 weeks u/s performed on 07/03/17. EDC 10/31/17. Admits to adderall use . Smokes tobacco 1/2-1 ppd . Denies other drug use or ETOH use. No h/o of HSV .  OB History    Gravida  4   Para  3   Term  2   Preterm  1   AB      Living  4     SAB      TAB      Ectopic      Multiple      Live Births  2          Past Medical History:  Diagnosis Date  . Depression   . Heart murmur   . Other and unspecified ovarian cysts 8/14  . Preterm labor   . Smoker 2014   Past Surgical History:  Procedure Laterality Date  . NO PAST SURGERIES     Family History: family history includes COPD in her mother; Diabetes in her father. Social History:  reports that she has been smoking cigarettes.  She has a 14.00 pack-year smoking history. She has never used smokeless tobacco. She reports that she has current or past drug history. Drug: Other-see comments. She reports that she does not drink alcohol.     Maternal Diabetes: not done Genetic Screening: not done Maternal Ultrasounds/Referrals: 22+6 week , normal anatomy  Fetal Ultrasounds or other Referrals:   Maternal Substance Abuse:  Yes:  Type: Smoker, Other: adderall Significant Maternal Medications:  None Significant Maternal Lab Results:  None Other Comments:  None  ROS History Dilation: 7 Effacement (%): 100 Station: Plus 2 Exam by:: Dr. Feliberto GottronSchermerhorn Last menstrual period 11/02/2016, unknown if currently breastfeeding. Exam Physical Exam  Prenatal labs:no prenatal labs done ,  ABO, Rh:   Antibody:   Rubella:   RPR:    HBsAg:    HIV:    GBS:     Assessment/Plan: active labor , anticipate SVD  Start ampicillin for unknown GBS  Ordered Prenatal labs UDS   Laura Vazquez 10/01/2017, 3:16 AM

## 2017-10-01 NOTE — Progress Notes (Signed)
Verbal consent to obtain UDS. Order placed.
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
## 2017-10-01 NOTE — Progress Notes (Signed)
Patient remains lethargic and unable to keep her eyes open. UDS remains only positive for Amphetamines.

## 2017-10-01 NOTE — Progress Notes (Signed)
Pt requesting to go downstairs for "fresh air". Encouraged patient we prefer her to stay on the unit since she just delivered overnight- patient refused. Patient's friend wheeled her downstairs outside. Fundus firm, bleeding small, steady gait to bathroom. Will continue to closely monitor and educate patient.

## 2017-10-01 NOTE — Progress Notes (Signed)
Patient in SCN visiting baby

## 2017-10-02 ENCOUNTER — Ambulatory Visit: Payer: Self-pay

## 2017-10-02 LAB — CBC
HCT: 34.5 % — ABNORMAL LOW (ref 35.0–47.0)
HEMOGLOBIN: 11.7 g/dL — AB (ref 12.0–16.0)
MCH: 27.7 pg (ref 26.0–34.0)
MCHC: 34.1 g/dL (ref 32.0–36.0)
MCV: 81.3 fL (ref 80.0–100.0)
PLATELETS: 262 10*3/uL (ref 150–440)
RBC: 4.24 MIL/uL (ref 3.80–5.20)
RDW: 14.4 % (ref 11.5–14.5)
WBC: 16.3 10*3/uL — ABNORMAL HIGH (ref 3.6–11.0)

## 2017-10-02 LAB — RPR
RPR Ser Ql: NONREACTIVE
RPR: NONREACTIVE

## 2017-10-02 LAB — CHLAMYDIA/NGC RT PCR (ARMC ONLY)
CHLAMYDIA TR: NOT DETECTED
N GONORRHOEAE: NOT DETECTED

## 2017-10-02 LAB — RUBELLA SCREEN: RUBELLA: 1.59 {index} (ref >0.99)

## 2017-10-02 LAB — SURGICAL PATHOLOGY

## 2017-10-02 MED ORDER — NICOTINE 21 MG/24HR TD PT24: 21.0000 mg | MEDICATED_PATCH | Freq: Every day | TRANSDERMAL | 0 refills | Status: DC

## 2017-10-02 MED ORDER — NICOTINE 21 MG/24HR TD PT24
21.0000 mg | MEDICATED_PATCH | Freq: Every day | TRANSDERMAL | Status: DC
  Administered 2017-10-02: 21 mg via TRANSDERMAL
  Filled 2017-10-02: qty 1

## 2017-10-02 MED ORDER — IBUPROFEN 600 MG PO TABS: 600.0000 mg | ORAL_TABLET | Freq: Four times a day (QID) | ORAL | 0 refills | Status: DC

## 2017-10-02 MED ORDER — ACETAMINOPHEN 325 MG PO TABS: 650.0000 mg | ORAL_TABLET | ORAL | Status: DC | PRN

## 2017-10-02 MED ORDER — PRENATAL MULTIVITAMIN CH: 1.0000 | ORAL_TABLET | Freq: Every day | ORAL | Status: DC

## 2017-10-02 NOTE — Progress Notes (Signed)
Provided and reviewed discharge paperwork and prescriptions. The patient and a family member at bedside verbalized understanding of instructions provided. Teach back method was used. Follow up provided on Monday, August 5 at 2:30pm. Provided much instruction regarding postpartum depression/baby blues related to edinburgh depression scale score. SW has already seen the patient regarding substance abuse and depression. Pt is also on zoloft and voices she knows resources available, and when to call for help. Pt to be discharged to go home, infant to stay in SCN. Awaiting birth certificate completion, then will discharge patient. Patient family member is here to take her home.

## 2017-10-02 NOTE — Progress Notes (Signed)
Post Partum Day 1 Subjective: Doing well, no complaints.  Tolerating regular diet, pain with PO meds, voiding and ambulating without difficulty. Pt drowsy this am, requesting nicotine patch.   No CP SOB Fever,Chills, N/V or leg pain; denies nipple or breast pain; no HA change of vision, RUQ/epigastric pain  Objective: BP 125/81 (BP Location: Right Arm)   Pulse (!) 101   Temp 98.2 F (36.8 C) (Oral)   Resp 18   Ht 4\' 11"  (1.499 m)   Wt 98 lb (44.5 kg)   LMP 11/02/2016 (Approximate)   SpO2 99%   Breastfeeding? Unknown   BMI 19.79 kg/m    Physical Exam:  General: NAD Breasts: soft/nontender CV: RRR Pulm: nl effort, CTABL Abdomen: soft, NT, BS x 4 Perineum: minimal edema, repair well approximated Lochia: small Uterine Fundus: fundus firm and 2 fb below umbilicus DVT Evaluation: no cords, ttp LEs   Recent Labs    10/01/17 0547 10/02/17 0511  HGB 11.6* 11.7*  HCT 34.2* 34.5*  WBC 18.4* 16.3*  PLT 240 262    Assessment/Plan: 32 y.o. [id number] postpartum day # 1  - Continue routine PP care- consdier Dc home today. Infant remains in SCN - encouraged snug fitting bra and cabbage leaves for bottlefeeding.  - Discussed contraceptive options including implant, IUDs hormonal and non-hormonal, injection, pills/ring/patch, condoms, and NFP. Pt declines all contraception- desires BTL.  - hemodynamically stable and asymptomatic; continue PNV x 6 wks PP - Immunization status: Needs tdap prior to DC, varicella not resulted.     Disposition: Does desire Dc home today.     Johncarlo Maalouf A, CNM 10/02/2017  9:51 AM

## 2017-10-02 NOTE — Lactation Note (Signed)
This note was copied from a baby's chart. Lactation Consultation Note  Patient Name: Girl Nicholaus Bloomatricia Mooradian Today's Date: 10/02/2017     Maternal Data    Feeding    LATCH Score                   Interventions    Lactation Tools Discussed/Used     Consult Status  LC to mother's room to discuss her plan and decision about pumping. Mother states that has made the decision to formula feed because she will not have transportation to and from the hospital to deliver breast milk.    Arlyss Gandylicia Dominika Losey 10/02/2017, 4:18 PM

## 2017-10-02 NOTE — Discharge Summary (Signed)
Obstetrical Discharge Summary  Patient Name: Laura Limeatricia C Heaps DOB: Jul 25, 1985 MRN: [id number]  Date of Admission: 10/01/2017 Date of Delivery:10/01/17 Delivered by: Beverly Gust Schermerhorn MD Date of Discharge: 10/02/2017  Primary OB: no prenatal care- had US at 22+6wks for dating.  ONG:EXBMWUX'LLMP:Patient's last menstrual period was 11/02/2016 (approximate). EDC Estimated Date of Delivery: 11/03/17 Gestational Age at Delivery: 5643w2d   Antepartum complications:  1. Tobacco use, 1-2ppd 2. No prenatal care 3. Poor dentition 4. Preterm labor  Admitting Diagnosis: Active preterm labor at 35wks Secondary Diagnosis: SVD with 1st deg perineal lac  Patient Active Problem List   Diagnosis Date Noted  . Premature uterine contractions 10/01/2017  . Tobacco use affecting pregnancy, antepartum 01/13/2015  . History of poor fetal growth 01/13/2015  . Substance abuse affecting pregnancy, antepartum 01/13/2015    Augmentation: none Complications: None Intrapartum complications/course: precipitous delivery with meconium stained fluid.  Date of Delivery: 10/01/17 Delivered By: Schermerhorn Delivery Type: spontaneous vaginal delivery Anesthesia: local; IVPM x 1 dose.  Placenta: spontaneous Laceration: 1st deg lac repaired Episiotomy: none Newborn Data: Live born female  Birth Weight: 5 lb 2.5 oz (2340 g) APGAR: 7, 9  Newborn Delivery   Birth date/time:  10/01/2017 02:55:00 Delivery type:  Vaginal, Spontaneous       Postpartum Procedures: none  Post partum course:  Patient had an uncomplicated postpartum course.  By time of discharge on PPD#1, her pain was controlled on oral pain medications; she had appropriate lochia and was ambulating, voiding without difficulty and tolerating regular diet.  She was deemed stable for discharge to home.    Discharge Physical Exam:  BP 125/81 (BP Location: Right Arm)   Pulse (!) 101   Temp 98.2 F (36.8 C) (Oral)   Resp 18   Ht 4\' 11"  (1.499 m)   Wt 98 lb (44.5  kg)   LMP 11/02/2016 (Approximate)   SpO2 99%   Breastfeeding? Unknown   BMI 19.79 kg/m   General: NAD CV: RRR Pulm: CTABL, nl effort ABD: s/nd/nt, fundus firm and below the umbilicus Lochia: small Perineum: well approximated DVT Evaluation: LE non-ttp, no evidence of DVT on exam.  Hemoglobin  Date Value Ref Range Status  10/02/2017 11.7 (L) 12.0 - 16.0 g/dL Final   HGB  Date Value Ref Range Status  06/20/2014 14.4 12.0 - 16.0 g/dL Final   HCT  Date Value Ref Range Status  10/02/2017 34.5 (L) 35.0 - 47.0 % Final  06/20/2014 42.9 35.0 - 47.0 % Final     Disposition: stable, discharge to home. Baby Feeding: formula Baby Disposition: remains in SCN  Rh Immune globulin given: n/a Rubella vaccine given: n/a Varicella vaccine given:  Tdap vaccine given in PP setting: ordered at DC Flu vaccine given in AP or PP setting: n/a  Contraception: plans condoms, desires BTL  Prenatal Labs:  Blood type/Rh  A Pos  Antibody screen neg  Rubella Immune  Varicella   Pending  RPR NR  HBsAg Neg  HIV NR  GC neg  Chlamydia neg  GBS  not done     Plan:  Laura Vazquez was discharged to home in good condition. Follow-up appointment with delivering provider in 6 weeks.  Discharge Medications: Allergies as of 10/02/2017      Reactions   Benadryl [diphenhydramine Hcl] Hives   Clindamycin/lincomycin Hives   Codeine Hives      Tramadol Nausea And Vomiting      Medication List    STOP taking these medications   amoxicillin 500  MG capsule Commonly known as:  AMOXIL   azithromycin 250 MG tablet Commonly known as:  ZITHROMAX   HYDROcodone-acetaminophen 5-325 MG tablet Commonly known as:  NORCO/VICODIN   nitrofurantoin (macrocrystal-monohydrate) 100 MG capsule Commonly known as:  MACROBID   predniSONE 10 MG tablet Commonly known as:  DELTASONE     TAKE these medications   acetaminophen 325 MG tablet Commonly known as:  TYLENOL Take 2 tablets (650 mg total) by  mouth every 4 (four) hours as needed (for pain scale < 4). What changed:    medication strength  how much to take  when to take this  reasons to take this   ibuprofen 600 MG tablet Commonly known as:  ADVIL,MOTRIN Take 1 tablet (600 mg total) by mouth every 6 (six) hours. What changed:    when to take this  additional instructions   nicotine 21 mg/24hr patch Commonly known as:  NICODERM CQ - dosed in mg/24 hours Place 1 patch (21 mg total) onto the skin daily. Start taking on:  10/03/2017   prenatal multivitamin Tabs tablet Take 1 tablet by mouth daily at 12 noon. Continue for 6 wks postpartum   sertraline 50 MG tablet Commonly known as:  ZOLOFT Take 50 mg by mouth daily.       Follow-up Information    Schermerhorn, Ihor Austin, MD Follow up in 2 week(s).   Specialty:  Obstetrics and Gynecology Why:  pre-op for BTL Contact information: [address] [place] [address] [phone]           Signed:  Randa Ngo, CNM 10/02/2017  10:12 AM

## 2017-10-03 LAB — VARICELLA ZOSTER ANTIBODY, IGG: Varicella IgG: 829 index (ref >165)

## 2017-11-04 ENCOUNTER — Ambulatory Visit: Payer: Self-pay | Admitting: Obstetrics and Gynecology

## 2017-11-20 ENCOUNTER — Encounter: Payer: Medicaid Other | Admitting: Obstetrics and Gynecology

## 2017-11-22 ENCOUNTER — Encounter: Payer: Medicaid Other | Admitting: Obstetrics and Gynecology

## 2018-03-13 ENCOUNTER — Encounter: Payer: Self-pay | Admitting: Obstetrics and Gynecology

## 2019-03-13 NOTE — L&D Delivery Note (Signed)
Delivery Note  [name] is a [id number] at [redacted]w[redacted]d with an LMP of 02/26/2020, inconsistent with [place] at [redacted]w[redacted]d.   First Stage: Labor onset: 0100 Augmentation : oxytocin Analgesia /Anesthesia intrapartum: Epidural pPROM at 1100 on 10/15/2019  Second Stage: Complete dilation at 0346 Onset of pushing at 0346 FHR second stage 135 bpm with moderate variability, early decels  [name] presented to L&D with pPROM.  Oxytocin was initiated for augmentation.  She progressed quickly to C/C/+3.  Upon arrival to the room, the fetal head was out.   Delivery of a viable baby girl 10/16/2019 at 0348 by CNM Delivery of fetal head in OA position with restitution to ROT. No nuchal cord;  Anterior then posterior shoulders delivered easily with gentle downward traction. Baby placed on mom's chest, and attended to by peds. Cord double clamped after cessation of pulsation, cut by sister  Cord blood sample collected: Not indicated A pos  Third Stage: Oxytocin bolus started after delivery of infant for hemorrhage prophylaxis  Placenta delivered intact with 3 VC @ 0355 Placenta disposition: to lab Uterine tone firm / bleeding moderate  No laceration identified  Anesthesia for repair: N/A Repair N/A Est. Blood Loss (mL): 70ml  Complications: Precipitous birth  Mom to postpartum.  Baby to Couplet care / Skin to Skin.  Newborn: Information for the patient's newborn:  Laura Vazquez, Laura Vazquez [[id number]]  Live born female  Birth Weight: 5 lb 10 oz (2550 g) APGAR: 8, 9  Newborn Delivery   Birth date/time: 10/16/2019 03:48:00 Delivery type: Vaginal, Spontaneous      ---------- Margaretmary Eddy, CNM Certified Nurse Midwife [facility]

## 2019-08-31 ENCOUNTER — Other Ambulatory Visit: Payer: Self-pay

## 2019-08-31 ENCOUNTER — Ambulatory Visit (LOCAL_COMMUNITY_HEALTH_CENTER): Payer: Medicaid Other

## 2019-08-31 VITALS — BP 128/77 | Ht 59.0 in | Wt 110.5 lb

## 2019-08-31 DIAGNOSIS — Z3201 Encounter for pregnancy test, result positive: Secondary | ICD-10-CM

## 2019-08-31 LAB — PREGNANCY, URINE: Preg Test, Ur: POSITIVE — AB

## 2019-08-31 NOTE — Progress Notes (Signed)
PNC declined today as needs gummy PNC. Encouraged to purchase and start ASAP. States has initial appt today at [facility] for rehab (hx of methamphetimine use). Per client, married x 13 years but currently separated from spouse as unable to get clean living with him as he uses drugs. PHQ-9 score = 19, declined consult with [facility] provider today. Jossie Ng, RN

## 2019-09-04 NOTE — Progress Notes (Signed)
OB abstraction per Hal Hope phone interview.Burt Knack, RN

## 2019-09-07 ENCOUNTER — Ambulatory Visit: Payer: Medicaid Other | Admitting: Family Medicine

## 2019-09-07 ENCOUNTER — Other Ambulatory Visit: Payer: Self-pay

## 2019-09-07 VITALS — BP 107/64 | HR 83 | Temp 95.5°F | Wt 115.8 lb

## 2019-09-07 DIAGNOSIS — O09899 Supervision of other high risk pregnancies, unspecified trimester: Secondary | ICD-10-CM | POA: Insufficient documentation

## 2019-09-07 DIAGNOSIS — Z87898 Personal history of other specified conditions: Secondary | ICD-10-CM | POA: Insufficient documentation

## 2019-09-07 DIAGNOSIS — R600 Localized edema: Secondary | ICD-10-CM

## 2019-09-07 DIAGNOSIS — Z23 Encounter for immunization: Secondary | ICD-10-CM

## 2019-09-07 DIAGNOSIS — J45909 Unspecified asthma, uncomplicated: Secondary | ICD-10-CM

## 2019-09-07 DIAGNOSIS — O99519 Diseases of the respiratory system complicating pregnancy, unspecified trimester: Secondary | ICD-10-CM

## 2019-09-07 DIAGNOSIS — O99320 Drug use complicating pregnancy, unspecified trimester: Secondary | ICD-10-CM

## 2019-09-07 DIAGNOSIS — O0990 Supervision of high risk pregnancy, unspecified, unspecified trimester: Secondary | ICD-10-CM | POA: Diagnosis not present

## 2019-09-07 DIAGNOSIS — O99330 Smoking (tobacco) complicating pregnancy, unspecified trimester: Secondary | ICD-10-CM

## 2019-09-07 DIAGNOSIS — O99332 Smoking (tobacco) complicating pregnancy, second trimester: Secondary | ICD-10-CM

## 2019-09-07 DIAGNOSIS — Z8759 Personal history of other complications of pregnancy, childbirth and the puerperium: Secondary | ICD-10-CM

## 2019-09-07 DIAGNOSIS — O0930 Supervision of pregnancy with insufficient antenatal care, unspecified trimester: Secondary | ICD-10-CM | POA: Insufficient documentation

## 2019-09-07 DIAGNOSIS — F319 Bipolar disorder, unspecified: Secondary | ICD-10-CM

## 2019-09-07 LAB — OB RESULTS CONSOLE GC/CHLAMYDIA
Chlamydia: NEGATIVE
Gonorrhea: NEGATIVE

## 2019-09-07 MED ORDER — ALBUTEROL SULFATE HFA 108 (90 BASE) MCG/ACT IN AERS: 2.0000 | INHALATION_SPRAY | Freq: Four times a day (QID) | RESPIRATORY_TRACT | 3 refills | Status: AC | PRN
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
## 2019-09-07 NOTE — Progress Notes (Signed)
Wet [place] results reviewed. Per standing orders no treatment indicated. Tdap given and tolerated well. Devory Mckinzie, RN ° °

## 2019-09-07 NOTE — Progress Notes (Addendum)
Here today for 27.4 week MH IP.  Not taking PNV due to "can't swallow." Denies ED/hospital visits since +PT. Declines Flu vaccine. 1hgtt today. Tawny Hopping, RN

## 2019-09-08 ENCOUNTER — Encounter: Payer: Self-pay | Admitting: Family Medicine

## 2019-09-08 ENCOUNTER — Other Ambulatory Visit: Payer: Self-pay | Admitting: Family Medicine

## 2019-09-08 DIAGNOSIS — R600 Localized edema: Secondary | ICD-10-CM | POA: Insufficient documentation

## 2019-09-08 DIAGNOSIS — Z8659 Personal history of other mental and behavioral disorders: Secondary | ICD-10-CM | POA: Insufficient documentation

## 2019-09-08 DIAGNOSIS — F319 Bipolar disorder, unspecified: Secondary | ICD-10-CM | POA: Insufficient documentation

## 2019-09-08 LAB — PROTEIN / CREATININE RATIO, URINE
Creatinine, Urine: 68.6 mg/dL
Protein, Ur: 6.5 mg/dL
Protein/Creat Ratio: 95 mg/g creat (ref 0–200)

## 2019-09-08 LAB — URINALYSIS
Bilirubin, UA: NEGATIVE
Glucose, UA: NEGATIVE
Ketones, UA: NEGATIVE
Leukocytes,UA: NEGATIVE
Nitrite, UA: NEGATIVE
Protein,UA: NEGATIVE
RBC, UA: NEGATIVE
Specific Gravity, UA: 1.02 (ref 1.005–1.030)
Urobilinogen, Ur: 0.2 mg/dL (ref 0.2–1.0)
pH, UA: 7 (ref 5.0–7.5)

## 2019-09-08 LAB — HCV AB W/RFLX TO VERIFICATION: HCV Ab: <0.1 s/co ratio (ref 0.0–0.9)

## 2019-09-08 LAB — WET PREP FOR TRICH, YEAST, CLUE
Trichomonas Exam: NEGATIVE
Yeast Exam: NEGATIVE

## 2019-09-08 LAB — HEMOGLOBIN, FINGERSTICK: Hemoglobin: 11.3 g/dL (ref 11.1–15.9)

## 2019-09-08 LAB — HCV INTERPRETATION

## 2019-09-09 ENCOUNTER — Other Ambulatory Visit: Payer: Self-pay | Admitting: Family Medicine

## 2019-09-09 DIAGNOSIS — IMO0002 Reserved for concepts with insufficient information to code with codable children: Secondary | ICD-10-CM

## 2019-09-09 LAB — AMPHETAMINE CONF, UR
Amphetamine GC/MS Conf: 1918 ng/mL
Amphetamine: POSITIVE — AB
Amphetamines: POSITIVE — AB
Methamphetamine Quant, Ur: 4138 ng/mL
Methamphetamine: POSITIVE — AB

## 2019-09-09 LAB — 789231 7+OXYCODONE-BUND
BENZODIAZ UR QL: NEGATIVE ng/mL
Barbiturate screen, urine: NEGATIVE ng/mL
Cannabinoid Quant, Ur: NEGATIVE ng/mL
Cocaine (Metab.): NEGATIVE ng/mL
OPIATE SCREEN URINE: NEGATIVE ng/mL
Oxycodone/Oxymorphone, Urine: NEGATIVE ng/mL
PCP Quant, Ur: NEGATIVE ng/mL

## 2019-09-09 LAB — CHLAMYDIA/GC NAA, CONFIRMATION
Chlamydia trachomatis, NAA: NEGATIVE
Neisseria gonorrhoeae, NAA: NEGATIVE

## 2019-09-09 LAB — URINE CULTURE

## 2019-09-09 LAB — IGP, APTIMA HPV
HPV Aptima: NEGATIVE
PAP Smear Comment: 0

## 2019-09-17 ENCOUNTER — Other Ambulatory Visit: Payer: Self-pay

## 2019-09-17 ENCOUNTER — Ambulatory Visit
Admission: RE | Admit: 2019-09-17 | Discharge: 2019-09-17 | Disposition: A | Discharge status: Home / Self Care | Payer: Medicaid Other | Source: Ambulatory Visit | Attending: Family Medicine | Admitting: Family Medicine

## 2019-09-17 ENCOUNTER — Encounter: Payer: Self-pay | Admitting: Family Medicine

## 2019-09-17 DIAGNOSIS — Z653 Problems related to other legal circumstances: Secondary | ICD-10-CM | POA: Insufficient documentation

## 2019-09-17 DIAGNOSIS — Z0489 Encounter for examination and observation for other specified reasons: Secondary | ICD-10-CM | POA: Insufficient documentation

## 2019-09-17 DIAGNOSIS — IMO0002 Reserved for concepts with insufficient information to code with codable children: Secondary | ICD-10-CM

## 2019-09-17 NOTE — Progress Notes (Signed)
[place] Conference Date: 09/17/19  Laura Vazquez was identified by clinical staff to benefit from an interdisciplinary team approach to help improve pregnancy care.  The [place] includes the maternity clinic coordinator (RN), medical providers (MD/APP staff), Care Management -OBCM and [facility], Centering Pregnancy coordinator, Infant Mortality reduction Geophysical data processor.  Nursing staff are also encouraged to participate. The group meets monthly to discuss patient care and coordinate services.   The patient's care care at the agency was reviewed in EMR and high risk factors evaluated in an interdisciplinary approach.    Value added interventions discussed at this care conference today were: - Reviewed case for first time with team - OBCM not present for case review but has been alerted to need for involvement  - Patient with recent meth use and positive UDS for methamphetamines at new OB  She declined [facility] referral at this visit, was given plan of safe care  Recommendations= UDS at every visit, verbal review with client if she has used recently, offer narcan kit - Has [place] today at [facility]

## 2019-09-21 ENCOUNTER — Encounter: Payer: Self-pay | Admitting: Family Medicine

## 2019-09-22 ENCOUNTER — Ambulatory Visit: Payer: Self-pay

## 2019-09-24 ENCOUNTER — Telehealth: Payer: Self-pay

## 2019-09-24 NOTE — Telephone Encounter (Signed)
Attempted to call & reschedule missed appt; (also will need redraw of labs not resulted, per lab personnel); phone not in service & contact # attempted but call won't complete Sharlette Dense, RN

## 2019-09-28 NOTE — Telephone Encounter (Signed)
Attempted x 2 to call cell #-"call cannot be completed" Sharlette Dense, RN

## 2019-09-29 NOTE — Telephone Encounter (Signed)
Needs to reschedule missed [facility] appt. Call to client and per recorded message, number can not be completed as dialed. Call to emergency contact (spouse) and unable to leave message as only able to hear an immediate beeping sound. Jossie Ng, RN

## 2019-09-30 NOTE — Telephone Encounter (Signed)
Call to client to reschedule [facility] RV appt. When number dialed, only heard "welcome to [employer]" alternating with ring tones in an alternating pattern. Call to emergency contact and immediate beeping sound heard. Jossie Ng, RN

## 2019-10-02 NOTE — Telephone Encounter (Signed)
Client has appt scheduled for Monday, 10/05/2019.  Jossie Ng, RN

## 2019-10-05 ENCOUNTER — Ambulatory Visit: Payer: Self-pay

## 2019-10-06 ENCOUNTER — Telehealth: Payer: Self-pay

## 2019-10-06 NOTE — Telephone Encounter (Signed)
[facility] for [facility] RV 10/05/2019. Call to client and per recorded message, call can't be completed as dialed. Call to emergency contact (spouse) at [phone] and immediate beeping sound heard - unable to leave message. Jossie Ng, RN

## 2019-10-08 NOTE — Telephone Encounter (Signed)
Call to client to reschedule missed [facility] appt and intermittently hear "welcome to [employer]" and ringing sound. Unable to leave message. Call to emergency contact and immediate beeping sound heard. Jossie Ng, RN

## 2019-10-12 DIAGNOSIS — Z91199 Patient's noncompliance with other medical treatment and regimen due to unspecified reason: Secondary | ICD-10-CM | POA: Insufficient documentation

## 2019-10-12 DIAGNOSIS — O09893 Supervision of other high risk pregnancies, third trimester: Secondary | ICD-10-CM | POA: Insufficient documentation

## 2019-10-15 ENCOUNTER — Inpatient Hospital Stay
Admission: EM | Admit: 2019-10-15 | Discharge: 2019-10-17 | DRG: 806 | Disposition: A | Discharge status: Home / Self Care | Payer: Medicaid Other

## 2019-10-15 ENCOUNTER — Other Ambulatory Visit: Payer: Self-pay

## 2019-10-15 ENCOUNTER — Encounter: Payer: Self-pay | Admitting: Obstetrics and Gynecology

## 2019-10-15 DIAGNOSIS — O99334 Smoking (tobacco) complicating childbirth: Secondary | ICD-10-CM | POA: Diagnosis present

## 2019-10-15 DIAGNOSIS — Z20822 Contact with and (suspected) exposure to covid-19: Secondary | ICD-10-CM | POA: Diagnosis present

## 2019-10-15 DIAGNOSIS — Z3A35 35 weeks gestation of pregnancy: Secondary | ICD-10-CM | POA: Diagnosis not present

## 2019-10-15 DIAGNOSIS — Z91199 Patient's noncompliance with other medical treatment and regimen due to unspecified reason: Secondary | ICD-10-CM

## 2019-10-15 DIAGNOSIS — O9081 Anemia of the puerperium: Secondary | ICD-10-CM | POA: Diagnosis not present

## 2019-10-15 DIAGNOSIS — F1590 Other stimulant use, unspecified, uncomplicated: Secondary | ICD-10-CM | POA: Diagnosis present

## 2019-10-15 DIAGNOSIS — F17210 Nicotine dependence, cigarettes, uncomplicated: Secondary | ICD-10-CM | POA: Diagnosis present

## 2019-10-15 DIAGNOSIS — D62 Acute posthemorrhagic anemia: Secondary | ICD-10-CM | POA: Diagnosis not present

## 2019-10-15 DIAGNOSIS — O42013 Preterm premature rupture of membranes, onset of labor within 24 hours of rupture, third trimester: Secondary | ICD-10-CM | POA: Diagnosis present

## 2019-10-15 DIAGNOSIS — O99324 Drug use complicating childbirth: Secondary | ICD-10-CM | POA: Diagnosis present

## 2019-10-15 DIAGNOSIS — J45909 Unspecified asthma, uncomplicated: Secondary | ICD-10-CM | POA: Diagnosis present

## 2019-10-15 DIAGNOSIS — O42913 Preterm premature rupture of membranes, unspecified as to length of time between rupture and onset of labor, third trimester: Principal | ICD-10-CM | POA: Diagnosis present

## 2019-10-15 DIAGNOSIS — O9952 Diseases of the respiratory system complicating childbirth: Secondary | ICD-10-CM | POA: Diagnosis present

## 2019-10-15 DIAGNOSIS — O4700 False labor before 37 completed weeks of gestation, unspecified trimester: Secondary | ICD-10-CM | POA: Diagnosis present

## 2019-10-15 LAB — URINE DRUG SCREEN, QUALITATIVE (ARMC ONLY)
Amphetamines, Ur Screen: NOT DETECTED
Barbiturates, Ur Screen: NOT DETECTED
Benzodiazepine, Ur Scrn: NOT DETECTED
Cannabinoid 50 Ng, Ur ~~LOC~~: NOT DETECTED
Cocaine Metabolite,Ur ~~LOC~~: NOT DETECTED
MDMA (Ecstasy)Ur Screen: NOT DETECTED
Methadone Scn, Ur: NOT DETECTED
Opiate, Ur Screen: NOT DETECTED
Phencyclidine (PCP) Ur S: NOT DETECTED
Tricyclic, Ur Screen: NOT DETECTED

## 2019-10-15 LAB — CBC
HCT: 33.8 % — ABNORMAL LOW (ref 36.0–46.0)
Hemoglobin: 11.3 g/dL — ABNORMAL LOW (ref 12.0–15.0)
MCH: 25.6 pg — ABNORMAL LOW (ref 26.0–34.0)
MCHC: 33.4 g/dL (ref 30.0–36.0)
MCV: 76.6 fL — ABNORMAL LOW (ref 80.0–100.0)
Platelets: 298 10*3/uL (ref 150–400)
RBC: 4.41 MIL/uL (ref 3.87–5.11)
RDW: 14.8 % (ref 11.5–15.5)
WBC: 13.9 10*3/uL — ABNORMAL HIGH (ref 4.0–10.5)
nRBC: 0.2 % (ref 0.0–0.2)

## 2019-10-15 LAB — WET PREP, GENITAL
Clue Cells Wet Prep HPF POC: NONE SEEN
Sperm: NONE SEEN
Trich, Wet Prep: NONE SEEN
Yeast Wet Prep HPF POC: NONE SEEN

## 2019-10-15 LAB — URINALYSIS, COMPLETE (UACMP) WITH MICROSCOPIC
Bacteria, UA: NONE SEEN
Bilirubin Urine: NEGATIVE
Glucose, UA: NEGATIVE mg/dL
Hgb urine dipstick: NEGATIVE
Ketones, ur: NEGATIVE mg/dL
Leukocytes,Ua: NEGATIVE
Nitrite: NEGATIVE
Protein, ur: 30 mg/dL — AB
Specific Gravity, Urine: 1.026 (ref 1.005–1.030)
pH: 6 (ref 5.0–8.0)

## 2019-10-15 LAB — GROUP B STREP BY PCR: Group B strep by PCR: NEGATIVE

## 2019-10-15 LAB — TYPE AND SCREEN
ABO/RH(D): A POS
Antibody Screen: NEGATIVE

## 2019-10-15 LAB — SARS CORONAVIRUS 2 BY RT PCR (HOSPITAL ORDER, PERFORMED IN ~~LOC~~ HOSPITAL LAB): SARS Coronavirus 2: NEGATIVE

## 2019-10-15 MED ORDER — SOD CITRATE-CITRIC ACID 500-334 MG/5ML PO SOLN: 30.0000 mL | ORAL | Status: DC | PRN

## 2019-10-15 MED ORDER — OXYTOCIN 10 UNIT/ML IJ SOLN
INTRAMUSCULAR | Status: AC
  Filled 2019-10-15: qty 2

## 2019-10-15 MED ORDER — PENICILLIN G POT IN DEXTROSE 60000 UNIT/ML IV SOLN: 3.0000 10*6.[IU] | INTRAVENOUS | Status: DC

## 2019-10-15 MED ORDER — TERBUTALINE SULFATE 1 MG/ML IJ SOLN: 0.2500 mg | Freq: Once | INTRAMUSCULAR | Status: DC | PRN

## 2019-10-15 MED ORDER — MISOPROSTOL 200 MCG PO TABS
ORAL_TABLET | ORAL | Status: AC
  Filled 2019-10-15: qty 4

## 2019-10-15 MED ORDER — LACTATED RINGERS IV SOLN: INTRAVENOUS | Status: DC

## 2019-10-15 MED ORDER — OXYTOCIN BOLUS FROM INFUSION
333.0000 mL | Freq: Once | INTRAVENOUS | Status: AC
  Administered 2019-10-16: 333 mL via INTRAVENOUS

## 2019-10-15 MED ORDER — OXYTOCIN-SODIUM CHLORIDE 30-0.9 UT/500ML-% IV SOLN
2.5000 [IU]/h | INTRAVENOUS | Status: DC
  Administered 2019-10-16: 2.5 [IU]/h via INTRAVENOUS
  Filled 2019-10-15: qty 1000

## 2019-10-15 MED ORDER — ACETAMINOPHEN 325 MG PO TABS: 650.0000 mg | ORAL_TABLET | ORAL | Status: DC | PRN

## 2019-10-15 MED ORDER — ONDANSETRON HCL 4 MG/2ML IJ SOLN: 4.0000 mg | Freq: Four times a day (QID) | INTRAMUSCULAR | Status: DC | PRN

## 2019-10-15 MED ORDER — FENTANYL CITRATE (PF) 100 MCG/2ML IJ SOLN: 50.0000 ug | INTRAMUSCULAR | Status: DC | PRN

## 2019-10-15 MED ORDER — SODIUM CHLORIDE 0.9 % IV SOLN
5.0000 10*6.[IU] | Freq: Once | INTRAVENOUS | Status: AC
  Administered 2019-10-15: 5 10*6.[IU] via INTRAVENOUS
  Filled 2019-10-15: qty 5

## 2019-10-15 MED ORDER — CALCIUM CARBONATE ANTACID 500 MG PO CHEW: 2.0000 | CHEWABLE_TABLET | ORAL | Status: DC | PRN

## 2019-10-15 MED ORDER — OXYTOCIN-SODIUM CHLORIDE 30-0.9 UT/500ML-% IV SOLN
1.0000 m[IU]/min | INTRAVENOUS | Status: DC
  Administered 2019-10-15: 2 m[IU]/min via INTRAVENOUS

## 2019-10-15 MED ORDER — LIDOCAINE HCL (PF) 1 % IJ SOLN
30.0000 mL | INTRAMUSCULAR | Status: DC | PRN
  Filled 2019-10-15: qty 30

## 2019-10-15 MED ORDER — ACETAMINOPHEN 500 MG PO TABS: 1000.0000 mg | ORAL_TABLET | Freq: Four times a day (QID) | ORAL | Status: DC | PRN

## 2019-10-15 MED ORDER — AMMONIA AROMATIC IN INHA
RESPIRATORY_TRACT | Status: AC
  Filled 2019-10-15: qty 10

## 2019-10-15 MED ORDER — LACTATED RINGERS IV SOLN: 500.0000 mL | INTRAVENOUS | Status: DC | PRN
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
## 2019-10-15 NOTE — OB Triage Note (Signed)
Pt G5P4, [redacted]w[redacted]d presents to birthplace though ED. Reports LOF starting around 11AM today, states it was clear watery fluid on her bed, reports leaking all day. States ctx started around the same time and getting worse. States 6 days clean off meth, reports daily cigarette use. Reports no vag bleeding, not feeling baby move as well w/ ctx. VSS, monitors applied and assessing, FHT 140 at 2004.

## 2019-10-16 ENCOUNTER — Encounter: Payer: Self-pay | Admitting: Obstetrics and Gynecology

## 2019-10-16 ENCOUNTER — Inpatient Hospital Stay: Payer: Medicaid Other | Admitting: Anesthesiology

## 2019-10-16 LAB — RAPID HIV SCREEN (HIV 1/2 AB+AG)
HIV 1/2 Antibodies: NONREACTIVE
HIV-1 P24 Antigen - HIV24: NONREACTIVE

## 2019-10-16 LAB — HEPATITIS B SURFACE ANTIGEN: Hepatitis B Surface Ag: NONREACTIVE

## 2019-10-16 LAB — CHLAMYDIA/NGC RT PCR (ARMC ONLY)
Chlamydia Tr: NOT DETECTED
N gonorrhoeae: NOT DETECTED

## 2019-10-16 LAB — RPR: RPR Ser Ql: NONREACTIVE

## 2019-10-16 MED ORDER — DIBUCAINE (PERIANAL) 1 % EX OINT: 1.0000 "application " | TOPICAL_OINTMENT | CUTANEOUS | Status: DC | PRN

## 2019-10-16 MED ORDER — ACETAMINOPHEN 500 MG PO TABS: 1000.0000 mg | ORAL_TABLET | Freq: Four times a day (QID) | ORAL | Status: DC | PRN

## 2019-10-16 MED ORDER — LIDOCAINE-EPINEPHRINE (PF) 1.5 %-1:200000 IJ SOLN
INTRAMUSCULAR | Status: DC | PRN
  Administered 2019-10-16: 3 mL via EPIDURAL

## 2019-10-16 MED ORDER — PHENYLEPHRINE 40 MCG/ML (10ML) SYRINGE FOR IV PUSH (FOR BLOOD PRESSURE SUPPORT): 80.0000 ug | PREFILLED_SYRINGE | INTRAVENOUS | Status: DC | PRN

## 2019-10-16 MED ORDER — SIMETHICONE 80 MG PO CHEW: 80.0000 mg | CHEWABLE_TABLET | ORAL | Status: DC | PRN

## 2019-10-16 MED ORDER — IBUPROFEN 600 MG PO TABS
600.0000 mg | ORAL_TABLET | Freq: Four times a day (QID) | ORAL | Status: DC
  Administered 2019-10-16 – 2019-10-17 (×5): 600 mg via ORAL
  Filled 2019-10-16 (×6): qty 1

## 2019-10-16 MED ORDER — LIDOCAINE HCL (PF) 1 % IJ SOLN
INTRAMUSCULAR | Status: DC | PRN
  Administered 2019-10-16: 3 mL

## 2019-10-16 MED ORDER — WITCH HAZEL-GLYCERIN EX PADS: 1.0000 "application " | MEDICATED_PAD | CUTANEOUS | Status: DC

## 2019-10-16 MED ORDER — EPHEDRINE 5 MG/ML INJ: 10.0000 mg | INTRAVENOUS | Status: DC | PRN

## 2019-10-16 MED ORDER — FENTANYL 2.5 MCG/ML W/ROPIVACAINE 0.15% IN NS 100 ML EPIDURAL (ARMC)
EPIDURAL | Status: AC
  Filled 2019-10-16: qty 100

## 2019-10-16 MED ORDER — LACTATED RINGERS IV SOLN: 500.0000 mL | Freq: Once | INTRAVENOUS | Status: DC

## 2019-10-16 MED ORDER — ONDANSETRON HCL 4 MG/2ML IJ SOLN: 4.0000 mg | INTRAMUSCULAR | Status: DC | PRN

## 2019-10-16 MED ORDER — PRENATAL MULTIVITAMIN CH
1.0000 | ORAL_TABLET | Freq: Every day | ORAL | Status: DC
  Administered 2019-10-16 – 2019-10-17 (×2): 1 via ORAL
  Filled 2019-10-16 (×2): qty 1

## 2019-10-16 MED ORDER — FENTANYL 2.5 MCG/ML W/ROPIVACAINE 0.15% IN NS 100 ML EPIDURAL (ARMC)
12.0000 mL/h | EPIDURAL | Status: DC
  Administered 2019-10-16: 12 mL/h via EPIDURAL

## 2019-10-16 MED ORDER — DOCUSATE SODIUM 100 MG PO CAPS
100.0000 mg | ORAL_CAPSULE | Freq: Two times a day (BID) | ORAL | Status: DC
  Administered 2019-10-17: 100 mg via ORAL
  Filled 2019-10-16: qty 1

## 2019-10-16 MED ORDER — COCONUT OIL OIL: 1.0000 "application " | TOPICAL_OIL | Status: DC | PRN

## 2019-10-16 MED ORDER — ONDANSETRON HCL 4 MG PO TABS
4.0000 mg | ORAL_TABLET | ORAL | Status: DC | PRN
  Filled 2019-10-16: qty 1

## 2019-10-16 MED ORDER — SODIUM CHLORIDE 0.9 % IV SOLN
INTRAVENOUS | Status: DC | PRN
  Administered 2019-10-16: 8 mL via EPIDURAL

## 2019-10-16 MED ORDER — BENZOCAINE-MENTHOL 20-0.5 % EX AERO: 1.0000 "application " | INHALATION_SPRAY | CUTANEOUS | Status: DC | PRN

## 2019-10-16 NOTE — Anesthesia Preprocedure Evaluation (Signed)
Anesthesia Evaluation  Patient identified by MRN, date of birth, ID band Patient awake    Reviewed: Allergy & Precautions, NPO status , Patient's Chart, lab work & pertinent test results  History of Anesthesia Complications Negative for: history of anesthetic complications  Airway Mallampati: II  TM Distance: >3 FB Neck ROM: Full    Dental  (+) Edentulous Lower, Edentulous Upper   Pulmonary neg sleep apnea, neg COPD, Current Smoker and Patient abstained from smoking.,    Pulmonary exam normal breath sounds clear to auscultation       Cardiovascular Exercise Tolerance: Good METS(-) hypertension(-) CAD and (-) Past MI negative cardio ROS  (-) dysrhythmias  Rhythm:Regular Rate:Normal - Systolic murmurs    Neuro/Psych PSYCHIATRIC DISORDERS Depression Bipolar Disorder negative neurological ROS     GI/Hepatic neg GERD  ,(+)     substance abuse  methamphetamine use, Not used in 6 days, negative utox   Endo/Other  neg diabetes  Renal/GU negative Renal ROS     Musculoskeletal   Abdominal   Peds  Hematology  (+) anemia , No hx easy bleeding or bruising   Anesthesia Other Findings Past Medical History: No date: Anemia     Comment:  per patient No date: Bipolar 1 disorder (HCC) No date: Depression No date: Depression complicating pregnancy, postpartum No date: Endometriosis No date: Heart murmur No date: History of urinary tract infection 8/14: Other and unspecified ovarian cysts No date: Preterm labor 2014: Smoker  Reproductive/Obstetrics (+) Pregnancy                             Anesthesia Physical Anesthesia Plan  ASA: II  Anesthesia Plan: Epidural   Post-op Pain Management:    Induction:   PONV Risk Score and Plan: 2 and Treatment may vary due to age or medical condition and Ondansetron  Airway Management Planned: Natural Airway  Additional Equipment:   Intra-op Plan:    Post-operative Plan:   Informed Consent: I have reviewed the patients History and Physical, chart, labs and discussed the procedure including the risks, benefits and alternatives for the proposed anesthesia with the patient or authorized representative who has indicated his/her understanding and acceptance.       Plan Discussed with: Surgeon  Anesthesia Plan Comments: (Discussed R/B/A of neuraxial anesthesia technique with patient: - rare risks of spinal/epidural hematoma, nerve damage, infection - Risk of PDPH - Risk of itching - Risk of nausea and vomiting - Risk of poor block necessitating replacement of epidural. Patient voiced understanding.)        Anesthesia Quick Evaluation

## 2019-10-16 NOTE — Anesthesia Procedure Notes (Signed)
Epidural Patient location during procedure: OB Start time: 10/16/2019 1:45 AM End time: 10/16/2019 2:05 AM  Staffing Anesthesiologist: Corinda Gubler, MD Performed: anesthesiologist   Preanesthetic Checklist Completed: patient identified, IV checked, site marked, risks and benefits discussed, surgical consent, monitors and equipment checked, pre-op evaluation and timeout performed  Epidural Patient position: sitting Prep: ChloraPrep Patient monitoring: heart rate, continuous pulse ox and blood pressure Approach: midline Location: L3-L4 Injection technique: LOR saline  Needle:  Needle type: Tuohy  Needle gauge: 17 G Needle length: 9 cm and 9 Needle insertion depth: 3.5 cm Catheter type: closed end flexible Catheter size: 19 Gauge Catheter at skin depth: 9 cm Test dose: negative and 1.5% lidocaine with Epi 1:200 K  Assessment Sensory level: T10 Events: blood not aspirated, injection not painful, no injection resistance, no paresthesia and negative IV test  Additional Notes 1 attempt Pt. Evaluated and documentation done after procedure finished. Patient identified. Risks/Benefits/Options discussed with patient including but not limited to bleeding, infection, nerve damage, paralysis, failed block, incomplete pain control, headache, blood pressure changes, nausea, vomiting, reactions to medication both or allergic, itching and postpartum back pain. Confirmed with bedside nurse the patient's most recent platelet count. Confirmed with patient that they are not currently taking any anticoagulation, have any bleeding history or any family history of bleeding disorders. Patient expressed understanding and wished to proceed. All questions were answered. Sterile technique was used throughout the entire procedure. Please see nursing notes for vital signs. Test dose was given through epidural catheter and negative prior to continuing to dose epidural or start infusion. Warning signs of high block  given to the patient including shortness of breath, tingling/numbness in hands, complete motor block, or any concerning symptoms with instructions to call for help. Patient was given instructions on fall risk and not to get out of bed. All questions and concerns addressed with instructions to call with any issues or inadequate analgesia.   Patient tolerated the insertion well without immediate complications.Reason for block:procedure for pain

## 2019-10-16 NOTE — Progress Notes (Signed)
Labor Progress Note  Laura Vazquez is a 34 y.o. [id number] at [redacted]w[redacted]d by ultrasound admitted for rupture of membranes  Subjective: Starting to get more comfortable after epidural   Objective: BP 134/81   Pulse (!) 104   Temp 98.3 F (36.8 C) (Oral)   Resp 16   Ht 4\' 11"  (1.499 m)   Wt 54.4 kg   LMP 02/26/2019 (Within Days)   SpO2 100%   BMI 24.24 kg/m  Notable VS details: reviewed   Fetal Assessment: FHT:  FHR: 130 bpm, variability: moderate,  accelerations:  Present,  decelerations:  Absent Category/reactivity:  Category I UC:   regular, every 2 minutes SVE:   7-8/80/0 by K. [birth year]1995 RN Membrane status: pPROM 10/15/2019 at 1100 Amniotic color: Clear  Labs: Lab Results  Component Value Date   WBC 13.9 (H) 10/15/2019   HGB 11.3 (L) 10/15/2019   HCT 33.8 (L) 10/15/2019   MCV 76.6 (L) 10/15/2019   PLT 298 10/15/2019    Assessment / Plan: Augmentation of labor, progressing well on pitocin   Labor: Progressing normally Preeclampsia:  no s/s Fetal Wellbeing:  Category I Pain Control:  Epidural I/D:  afebrile, ROM 17 hours   12/15/2019, CNM 10/16/2019, 2:16 AM
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
## 2019-10-17 DIAGNOSIS — D62 Acute posthemorrhagic anemia: Secondary | ICD-10-CM

## 2019-10-17 LAB — CBC
HCT: 32.5 % — ABNORMAL LOW (ref 36.0–46.0)
Hemoglobin: 10.6 g/dL — ABNORMAL LOW (ref 12.0–15.0)
MCH: 25.7 pg — ABNORMAL LOW (ref 26.0–34.0)
MCHC: 32.6 g/dL (ref 30.0–36.0)
MCV: 78.7 fL — ABNORMAL LOW (ref 80.0–100.0)
Platelets: 297 10*3/uL (ref 150–400)
RBC: 4.13 MIL/uL (ref 3.87–5.11)
RDW: 14.8 % (ref 11.5–15.5)
WBC: 13.1 10*3/uL — ABNORMAL HIGH (ref 4.0–10.5)
nRBC: 0 % (ref 0.0–0.2)

## 2019-10-17 LAB — VARICELLA ZOSTER ANTIBODY, IGG: Varicella IgG: 682 index (ref >165)

## 2019-10-17 LAB — RUBELLA SCREEN: Rubella: 1.45 index (ref >0.99)

## 2019-10-17 NOTE — Discharge Instructions (Signed)
Postpartum Care After Vaginal Delivery This sheet gives you information about how to care for yourself from the time you deliver your baby to up to 6-12 weeks after delivery (postpartum period). Your health care provider may also give you more specific instructions. If you have problems or questions, contact your health care provider. Follow these instructions at home: Vaginal bleeding  It is normal to have vaginal bleeding (lochia) after delivery. Wear a sanitary pad for vaginal bleeding and discharge. ? During the first week after delivery, the amount and appearance of lochia is often similar to a menstrual period. ? Over the next few weeks, it will gradually decrease to a dry, yellow-brown discharge. ? For most women, lochia stops completely by 4-6 weeks after delivery. Vaginal bleeding can vary from woman to woman.  Change your sanitary pads frequently. Watch for any changes in your flow, such as: ? A sudden increase in volume. ? A change in color. ? Large blood clots.  If you pass a blood clot from your vagina, save it and call your health care provider to discuss. Do not flush blood clots down the toilet before talking with your health care provider.  Do not use tampons or douches until your health care provider says this is safe.  If you are not breastfeeding, your period should return 6-8 weeks after delivery. If you are feeding your child breast milk only (exclusive breastfeeding), your period may not return until you stop breastfeeding. Perineal care  Keep the area between the vagina and the anus (perineum) clean and dry as told by your health care provider. Use medicated pads and pain-relieving sprays and creams as directed.  If you had a cut in the perineum (episiotomy) or a tear in the vagina, check the area for signs of infection until you are healed. Check for: ? More redness, swelling, or pain. ? Fluid or blood coming from the cut or tear. ? Warmth. ? Pus or a bad  smell.  You may be given a squirt bottle to use instead of wiping to clean the perineum area after you go to the bathroom. As you start healing, you may use the squirt bottle before wiping yourself. Make sure to wipe gently.  To relieve pain caused by an episiotomy, a tear in the vagina, or swollen veins in the anus (hemorrhoids), try taking a warm sitz bath 2-3 times a day. A sitz bath is a warm water bath that is taken while you are sitting down. The water should only come up to your hips and should cover your buttocks. Breast care  Within the first few days after delivery, your breasts may feel heavy, full, and uncomfortable (breast engorgement). Milk may also leak from your breasts. Your health care provider can suggest ways to help relieve the discomfort. Breast engorgement should go away within a few days.  If you are breastfeeding: ? Wear a bra that supports your breasts and fits you well. ? Keep your nipples clean and dry. Apply creams and ointments as told by your health care provider. ? You may need to use breast pads to absorb milk that leaks from your breasts. ? You may have uterine contractions every time you breastfeed for up to several weeks after delivery. Uterine contractions help your uterus return to its normal size. ? If you have any problems with breastfeeding, work with your health care provider or lactation consultant.  If you are not breastfeeding: ? Avoid touching your breasts a lot. Doing this can make   your breasts produce more milk. ? Wear a good-fitting bra and use cold packs to help with swelling. ? Do not squeeze out (express) milk. This causes you to make more milk. Intimacy and sexuality  Ask your health care provider when you can engage in sexual activity. This may depend on: ? Your risk of infection. ? How fast you are healing. ? Your comfort and desire to engage in sexual activity.  You are able to get pregnant after delivery, even if you have not had  your period. If desired, talk with your health care provider about methods of birth control (contraception). Medicines  Take over-the-counter and prescription medicines only as told by your health care provider.  If you were prescribed an antibiotic medicine, take it as told by your health care provider. Do not stop taking the antibiotic even if you start to feel better. Activity  Gradually return to your normal activities as told by your health care provider. Ask your health care provider what activities are safe for you.  Rest as much as possible. Try to rest or take a nap while your baby is sleeping. Eating and drinking   Drink enough fluid to keep your urine pale yellow.  Eat high-fiber foods every day. These may help prevent or relieve constipation. High-fiber foods include: ? Whole grain cereals and breads. ? Brown rice. ? Beans. ? Fresh fruits and vegetables.  Do not try to lose weight quickly by cutting back on calories.  Take your prenatal vitamins until your postpartum checkup or until your health care provider tells you it is okay to stop. Lifestyle  Do not use any products that contain nicotine or tobacco, such as cigarettes and e-cigarettes. If you need help quitting, ask your health care provider.  Do not drink alcohol, especially if you are breastfeeding. General instructions  Keep all follow-up visits for you and your baby as told by your health care provider. Most women visit their health care provider for a postpartum checkup within the first 3-6 weeks after delivery. Contact a health care provider if:  You feel unable to cope with the changes that your child brings to your life, and these feelings do not go away.  You feel unusually sad or worried.  Your breasts become red, painful, or hard.  You have a fever.  You have trouble holding urine or keeping urine from leaking.  You have little or no interest in activities you used to enjoy.  You have not  breastfed at all and you have not had a menstrual period for 12 weeks after delivery.  You have stopped breastfeeding and you have not had a menstrual period for 12 weeks after you stopped breastfeeding.  You have questions about caring for yourself or your baby.  You pass a blood clot from your vagina. Get help right away if:  You have chest pain.  You have difficulty breathing.  You have sudden, severe leg pain.  You have severe pain or cramping in your lower abdomen.  You bleed from your vagina so much that you fill more than one sanitary pad in one hour. Bleeding should not be heavier than your heaviest period.  You develop a severe headache.  You faint.  You have blurred vision or spots in your vision.  You have bad-smelling vaginal discharge.  You have thoughts about hurting yourself or your baby. If you ever feel like you may hurt yourself or others, or have thoughts about taking your own life, get help   right away. You can go to the nearest emergency department or call:  Your local emergency services (911 in the [place]  A suicide crisis helpline, such as the National Suicide Prevention Lifeline at 1-800-273-8255. This is open 24 hours a day. Summary  The period of time right after you deliver your newborn up to 6-12 weeks after delivery is called the postpartum period.  Gradually return to your normal activities as told by your health care provider.  Keep all follow-up visits for you and your baby as told by your health care provider. This information is not intended to replace advice given to you by your health care provider. Make sure you discuss any questions you have with your health care provider. Document Revised: 03/01/2017 Document Reviewed: 12/10/2016 Elsevier Patient Education  2020 Elsevier Inc.  Postpartum Baby Blues The postpartum period begins right after the birth of a baby. During this time, there is often a lot of joy and excitement. It is also a  time of many changes in the life of the parents. No matter how many times a mother gives birth, each child brings new challenges to the family, including different ways of relating to one another. It is common to have feelings of excitement along with confusing changes in moods, emotions, and thoughts. You may feel happy one minute and sad or stressed the next. These feelings of sadness usually happen in the period right after you have your baby, and they go away within a week or two. This is called the "baby blues." What are the causes? There is no known cause of baby blues. It is likely caused by a combination of factors. However, changes in hormone levels after childbirth are believed to trigger some of the symptoms. Other factors that can play a role in these mood changes include:  Lack of sleep.  Stressful life events, such as poverty, caring for a loved one, or death of a loved one.  Genetics. What are the signs or symptoms? Symptoms of this condition include:  Brief changes in mood, such as going from extreme happiness to sadness.  Decreased concentration.  Difficulty sleeping.  Crying spells and tearfulness.  Loss of appetite.  Irritability.  Anxiety. If the symptoms of baby blues last for more than 2 weeks or become more severe, you may have postpartum depression. How is this diagnosed? This condition is diagnosed based on an evaluation of your symptoms. There are no medical or lab tests that lead to a diagnosis, but there are various questionnaires that a health care provider may use to identify women with the baby blues or postpartum depression. How is this treated? Treatment is not needed for this condition. The baby blues usually go away on their own in 1-2 weeks. Social support is often all that is needed. You will be encouraged to get adequate sleep and rest. Follow these instructions at home: Lifestyle      Get as much rest as you can. Take a nap when the baby  sleeps.  Exercise regularly as told by your health care provider. Some women find yoga and walking to be helpful.  Eat a balanced and nourishing diet. This includes plenty of fruits and vegetables, whole grains, and lean proteins.  Do little things that you enjoy. Have a cup of tea, take a bubble bath, read your favorite magazine, or listen to your favorite music.  Avoid alcohol.  Ask for help with household chores, cooking, grocery shopping, or running errands. Do not try to   do everything yourself. Consider hiring a postpartum doula to help. This is a professional who specializes in providing support to new mothers.  Try not to make any major life changes during pregnancy or right after giving birth. This can add stress. General instructions  Talk to people close to you about how you are feeling. Get support from your partner, family members, friends, or other new moms. You may want to join a support group.  Find ways to cope with stress. This may include: ? Writing your thoughts and feelings in a journal. ? Spending time outside. ? Spending time with people who make you laugh.  Try to stay positive in how you think. Think about the things you are grateful for.  Take over-the-counter and prescription medicines only as told by your health care provider.  Let your health care provider know if you have any concerns.  Keep all postpartum visits as told by your health care provider. This is important. Contact a health care provider if:  Your baby blues do not go away after 2 weeks. Get help right away if:  You have thoughts of taking your own life (suicidal thoughts).  You think you may harm the baby or other people.  You see or hear things that are not there (hallucinations). Summary  After giving birth, you may feel happy one minute and sad or stressed the next. Feelings of sadness that happen right after the baby is born and go away after a week or two are called the "baby  blues."  You can manage the baby blues by getting enough rest, eating a healthy diet, exercising, spending time with supportive people, and finding ways to cope with stress.  If feelings of sadness and stress last longer than 2 weeks or get in the way of caring for your baby, talk to your health care provider. This may mean you have postpartum depression. This information is not intended to replace advice given to you by your health care provider. Make sure you discuss any questions you have with your health care provider. Document Revised: 06/20/2018 Document Reviewed: 04/24/2016 Elsevier Patient Education  2020 Elsevier Inc.  

## 2019-10-17 NOTE — Progress Notes (Signed)
Post Partum Day 1  Subjective: Doing well, no concerns. Ambulating without difficulty, pain managed with PO meds, tolerating regular diet, and voiding without difficulty.   No fever/chills, chest pain, shortness of breath, nausea/vomiting, or leg pain. No nipple or breast pain. No headache, visual changes, or RUQ/epigastric pain.  Objective: BP (!) 112/54 (BP Location: Left Arm)   Pulse 89   Temp 97.7 F (36.5 C) (Oral)   Resp 18   Ht 4\' 11"  (1.499 m)   Wt 54.4 kg   LMP 02/26/2019 (Within Days)   SpO2 100%   Breastfeeding Unknown   BMI 24.24 kg/m    Physical Exam:  General: alert and cooperative Breasts: soft/nontender CV: RRR Pulm: nl effort Abdomen: soft, non-tender Uterine Fundus: firm Incision: n/a Perineum: intact Lochia: appropriate DVT Evaluation: No evidence of DVT seen on physical exam. Edinburgh:  Edinburgh Postnatal Depression Scale Screening Tool 10/16/2019 10/02/2017  I have been able to laugh and see the funny side of things. 0 1  I have looked forward with enjoyment to things. 1 1  I have blamed myself unnecessarily when things went wrong. 3 2  I have been anxious or worried for no good reason. 2 1  I have felt scared or panicky for no good reason. 3 1  Things have been getting on top of me. 1 1  I have been so unhappy that I have had difficulty sleeping. 1 1  I have felt sad or miserable. 2 2  I have been so unhappy that I have been crying. 2 1  The thought of harming myself has occurred to me. 0 1  Edinburgh Postnatal Depression Scale Total 15 12     Recent Labs    10/15/19 2141 10/17/19 0551  HGB 11.3* 10.6*  HCT 33.8* 32.5*  WBC 13.9* 13.1*  PLT 298 297    Assessment/Plan: 34 y.o. 32 postpartum day # 1  -Continue routine postpartum care -SW and DSS referral to be completed -Acute blood loss anemia - hemodynamically stable and asymptomatic; start PO ferrous sulfate BID with stool softeners  -Immunization status: Unknown if she needs  Tdap/ all others up to date  Disposition: Continue inpatient postpartum care Desires discharge home today   LOS: 2 days   Lynsey Ange, CNM 10/17/2019, 10:16 AM

## 2019-10-17 NOTE — Progress Notes (Signed)
D/C to Home with sister. Alert and oriented with pleasant affect. Afeb. VSS. Assessment WNL. Denies c/o. V/O of D/C Instructions and F/U. Infnat D/C in care of Pt.'s Mom, Hortense Ramal as per DSS. Karren Cobble V/O of Infant D/C Instructions and F/U care.

## 2019-10-17 NOTE — Anesthesia Postprocedure Evaluation (Signed)
Anesthesia Post Note  Patient: Laura Vazquez  Procedure(s) Performed: AN AD HOC LABOR EPIDURAL  Patient location during evaluation: Mother Baby Anesthesia Type: Epidural Level of consciousness: awake and alert Pain management: pain level controlled Vital Signs Assessment: post-procedure vital signs reviewed and stable Respiratory status: spontaneous breathing, nonlabored ventilation and respiratory function stable Cardiovascular status: stable Postop Assessment: no headache, no backache, patient able to bend at knees and able to ambulate Anesthetic complications: no   No complications documented.   Last Vitals:  Vitals:   10/17/19 0440 10/17/19 0802  BP: 107/64 (!) 112/54  Pulse: 60 89  Resp: 18 18  Temp:  36.5 C  SpO2: 99% 100%    Last Pain:  Vitals:   10/17/19 0802  TempSrc: Oral  PainSc:                  Cleda Mccreedy Leighton Luster

## 2019-10-17 NOTE — TOC Progression Note (Addendum)
Transition of Care [facility]) - Progression Note    Patient Details  Name: Laura Vazquez MRN: [id number] Date of Birth: 1985/06/28  [facility]) CM/SW Contact  Larwance Rote, LCSW Phone Number: 10/17/2019, 2:30 PM  Clinical Narrative:   Social worker spoke to [facility] DSS/CPS on call social worker at 1400.    Patient's newborn can be release to DSS/CPS temporary safety provider Mrs. Hortense Ramal [phone].   RN please call Mrs. Orvan Falconer when infant is ready to be discharge.   1430 Patient provided with community resources and psychoeducaiton on postpartum depression.  Urged patient to follow-up with her primary care provider: [facility].    Expected Discharge Plan and Services    Social Determinants of Health (SDOH) Interventions    Readmission Risk Interventions No flowsheet data found.

## 2019-10-17 NOTE — Lactation Note (Signed)
This note was copied from a baby's chart. Lactation Consultation Note  Patient Name: Laura Vazquez [id number] Date: 10/17/2019   Mom's original feeding choice was to breast feed.  Mom has not put the baby to the breast since she delivered yesterday.  The baby was born at 35.6 weeks and weighed 2550 gms.  She has been getting Similac Special Care 22 calorie formula. Mom says she has no desire to put the baby to the breast or pump.  Handout given on Infant Formula Preparation and reviewed storage, cleaning and sanitizing of the workspace, feeding and preparation equipment, mixing powdered formula with water and how to protect her baby from cronobacter.  Explained how to dry up milk by getting on well fitting supportive bra, cold, cabbage leaves, not stimulating or extracting milk from the breast, etc.  Discussed differences, prevention and treatment of full breasts, engorgement, plugged ducts and mastitis and when and how to seek further assistance.  Encouraged mom to call with any further questions, concerns or assistance.  Maternal Data    Feeding    LATCH Score                   Interventions    Lactation Tools Discussed/Used     Consult Status      Louis Meckel 10/17/2019, 8:40 PM

## 2019-10-19 LAB — SURGICAL PATHOLOGY

## 2019-10-28 ENCOUNTER — Telehealth: Payer: Self-pay | Admitting: Licensed Clinical Social Worker

## 2019-10-28 NOTE — Telephone Encounter (Signed)
-----   Message from Kathreen Cos[place], [place] sent at 10/20/2019  1:26 PM EDT ----- Regarding: postpartum mood check - delivered 08/06 10/16/19 delivery - needs postpartum mood check

## 2019-12-03 NOTE — Addendum Note (Signed)
Addended by: Heywood Bene on: 12/03/2019 11:32 AM   Modules accepted: Orders

## 2019-12-22 NOTE — Progress Notes (Signed)
Certified letter sent by [facility] coordinator on 10/12/2019 returned, unopened. Patient chart closed to follow-up. Patient delivered on 10/16/2019 at 35 6/7 at [facility]. Certified letter and envelope sent for scanning.Burt Knack, RN

## 2020-03-03 ENCOUNTER — Encounter: Payer: Self-pay | Admitting: Obstetrics and Gynecology

## 2020-03-25 ENCOUNTER — Ambulatory Visit (LOCAL_COMMUNITY_HEALTH_CENTER): Payer: Medicaid Other

## 2020-03-25 ENCOUNTER — Other Ambulatory Visit: Payer: Self-pay

## 2020-03-25 VITALS — BP 99/61 | Ht 59.0 in | Wt 100.0 lb

## 2020-03-25 DIAGNOSIS — Z3201 Encounter for pregnancy test, result positive: Secondary | ICD-10-CM | POA: Diagnosis not present

## 2020-03-25 LAB — PREGNANCY, URINE: Preg Test, Ur: POSITIVE — AB

## 2020-03-25 MED ORDER — PRENATAL 27-0.8 MG PO TABS: 1.0000 | ORAL_TABLET | Freq: Every day | ORAL | 0 refills | Status: AC

## 2020-03-25 NOTE — Progress Notes (Addendum)
UPT positive today. Plans prenatal care at [facility]. Hx subst abuse. 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 Consult Hazle Coca, CNM who advises pt to establish prenatal care ASAP. RN discussed with pt provider's recommendation. Pt in agreement and plans to call [facility] today. Pt to clerk for preadmit. Jerel Shepherd, RN  I agree with above note by RN.  Hazle Coca, CNM

## 2020-05-27 ENCOUNTER — Other Ambulatory Visit: Payer: Self-pay

## 2020-05-27 ENCOUNTER — Telehealth: Payer: Self-pay | Admitting: Family Medicine

## 2020-05-27 ENCOUNTER — Encounter: Payer: Self-pay | Admitting: Family Medicine

## 2020-05-27 ENCOUNTER — Ambulatory Visit: Payer: Medicaid Other | Admitting: Family Medicine

## 2020-05-27 ENCOUNTER — Ambulatory Visit (LOCAL_COMMUNITY_HEALTH_CENTER): Payer: Medicaid Other

## 2020-05-27 VITALS — Ht 59.0 in | Wt 109.0 lb

## 2020-05-27 VITALS — BP 118/64 | HR 101 | Temp 97.4°F

## 2020-05-27 DIAGNOSIS — Z653 Problems related to other legal circumstances: Secondary | ICD-10-CM

## 2020-05-27 DIAGNOSIS — F1920 Other psychoactive substance dependence, uncomplicated: Secondary | ICD-10-CM

## 2020-05-27 DIAGNOSIS — Z8751 Personal history of pre-term labor: Secondary | ICD-10-CM | POA: Insufficient documentation

## 2020-05-27 DIAGNOSIS — A601 Herpesviral infection of perianal skin and rectum: Secondary | ICD-10-CM

## 2020-05-27 DIAGNOSIS — Z113 Encounter for screening for infections with a predominantly sexual mode of transmission: Secondary | ICD-10-CM

## 2020-05-27 DIAGNOSIS — Z111 Encounter for screening for respiratory tuberculosis: Secondary | ICD-10-CM

## 2020-05-27 DIAGNOSIS — O0992 Supervision of high risk pregnancy, unspecified, second trimester: Secondary | ICD-10-CM

## 2020-05-27 DIAGNOSIS — F1910 Other psychoactive substance abuse, uncomplicated: Secondary | ICD-10-CM | POA: Insufficient documentation

## 2020-05-27 LAB — WET PREP FOR TRICH, YEAST, CLUE
Trichomonas Exam: NEGATIVE
Yeast Exam: NEGATIVE

## 2020-05-27 LAB — HEMOGLOBIN, FINGERSTICK: Hemoglobin: 11.3 g/dL (ref 11.1–15.9)

## 2020-05-27 MED ORDER — ACYCLOVIR 400 MG PO TABS: 400.0000 mg | ORAL_TABLET | Freq: Three times a day (TID) | ORAL | 0 refills | Status: AC

## 2020-05-27 MED ORDER — CEFTRIAXONE SODIUM 500 MG IJ SOLR
500.0000 mg | Freq: Once | INTRAMUSCULAR | Status: AC
  Administered 2020-05-27: 500 mg via INTRAMUSCULAR

## 2020-05-27 MED ORDER — LIDOCAINE-MENTHOL (SPRAY) 4-1 % EX LIQD: 2.0000 | CUTANEOUS | 2 refills | Status: AC | PRN

## 2020-05-27 MED ORDER — AZITHROMYCIN 500 MG PO TABS: 1000.0000 mg | ORAL_TABLET | Freq: Every day | ORAL | 0 refills | Status: AC
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
## 2020-05-27 NOTE — Telephone Encounter (Signed)
Has the rxnumbing meds. been called in to [place] on [address]?

## 2020-05-27 NOTE — Telephone Encounter (Signed)
Return call to client and counseled that medications (Acyclovir and Lidocaine-Menthol Spray) have been e-prescribed by Dr. Alvester Morin. Jossie Ng, RN

## 2020-05-27 NOTE — Progress Notes (Signed)
RN admin PPD on left forearm. Tolerated well.   Harvie Heck, RN

## 2020-05-27 NOTE — Progress Notes (Signed)
Patient complains of vaginal and lower abdominal pain for 3 days. Yellow discharge per patient.   Patient reports due date 10/01/2020. Patient reports contractions since yesterday due to the pain she is experiencing.   Patient has paperwork she wants filled out for rehab admissions.   Harvie Heck, RN   Post:  RN reviewed wet mount with patient, patient treated chlamydia/ gonorrhea and herpes. RN admin PPD through TB clinic. Patient instructed to bring back forms for rehab on Monday. Patient scheduled for New OB.   Harvie Heck, RN

## 2020-05-29 LAB — CHLAMYDIA/GC NAA, CONFIRMATION
Chlamydia trachomatis, NAA: NEGATIVE
Neisseria gonorrhoeae, NAA: NEGATIVE

## 2020-05-30 ENCOUNTER — Telehealth: Payer: Self-pay | Admitting: Family Medicine

## 2020-05-30 ENCOUNTER — Ambulatory Visit (LOCAL_COMMUNITY_HEALTH_CENTER): Payer: Medicaid Other

## 2020-05-30 ENCOUNTER — Other Ambulatory Visit: Payer: Self-pay

## 2020-05-30 DIAGNOSIS — Z111 Encounter for screening for respiratory tuberculosis: Secondary | ICD-10-CM

## 2020-05-30 LAB — TB SKIN TEST
Induration: 0 mm
TB Skin Test: NEGATIVE

## 2020-05-30 NOTE — Telephone Encounter (Signed)
Pt was scheduled for NEW OB appt this morning but was unable to make it. Was calling to reschedule appt.

## 2020-05-30 NOTE — Telephone Encounter (Signed)
Phone call to pt at [phone].  Pt states she called this morning, could not make new OB appt this AM, needs afternoon appt. Pt scheduled for New OB appt for 06/07/20.  Pt states she will still be coming in today for PPD reading, in the PM around 1:00.

## 2020-05-31 ENCOUNTER — Encounter: Payer: Self-pay | Admitting: Family Medicine

## 2020-05-31 ENCOUNTER — Telehealth: Payer: Self-pay

## 2020-05-31 DIAGNOSIS — A6000 Herpesviral infection of urogenital system, unspecified: Secondary | ICD-10-CM | POA: Insufficient documentation

## 2020-05-31 DIAGNOSIS — O98119 Syphilis complicating pregnancy, unspecified trimester: Secondary | ICD-10-CM | POA: Insufficient documentation

## 2020-05-31 DIAGNOSIS — O0990 Supervision of high risk pregnancy, unspecified, unspecified trimester: Secondary | ICD-10-CM | POA: Insufficient documentation

## 2020-05-31 LAB — CBC/D/PLT+RPR+RH+ABO+RUB AB...
Antibody Screen: NEGATIVE
Basophils Absolute: 0.1 10*3/uL (ref 0.0–0.2)
Basos: 1 %
EOS (ABSOLUTE): 0.5 10*3/uL — ABNORMAL HIGH (ref 0.0–0.4)
Eos: 4 %
Hematocrit: 33.4 % — ABNORMAL LOW (ref 34.0–46.6)
Hemoglobin: 11.2 g/dL (ref 11.1–15.9)
Hepatitis B Surface Ag: NEGATIVE
Immature Grans (Abs): 0.1 10*3/uL (ref 0.0–0.1)
Immature Granulocytes: 1 %
Lymphocytes Absolute: 2.2 10*3/uL (ref 0.7–3.1)
Lymphs: 18 %
MCH: 29.6 pg (ref 26.6–33.0)
MCHC: 33.5 g/dL (ref 31.5–35.7)
MCV: 88 fL (ref 79–97)
Monocytes Absolute: 0.8 10*3/uL (ref 0.1–0.9)
Monocytes: 6 %
Neutrophils Absolute: 8.7 10*3/uL — ABNORMAL HIGH (ref 1.4–7.0)
Neutrophils: 70 %
Platelets: 308 10*3/uL (ref 150–450)
RBC: 3.79 x10E6/uL (ref 3.77–5.28)
RDW: 14.7 % (ref 11.7–15.4)
RPR Ser Ql: REACTIVE — AB
Rh Factor: POSITIVE
Rubella Antibodies, IGG: 1.35 index (ref >0.99)
Varicella zoster IgG: 812 index (ref >165)
WBC: 12.3 10*3/uL — ABNORMAL HIGH (ref 3.4–10.8)

## 2020-05-31 LAB — RPR, QUANT+TP ABS (REFLEX)
Rapid Plasma Reagin, Quant: 1:8 {titer} — ABNORMAL HIGH
T Pallidum Abs: REACTIVE — AB

## 2020-05-31 NOTE — Telephone Encounter (Signed)
Call to Laura Vazquez DIS to inform of patients reactive RPR through our maternity health clinic. Laura Vazquez who handles pregnant cases is not in the office this week. DIS requested RN to fax records of RPR results. Results faxed.   Harvie Heck, RN

## 2020-05-31 NOTE — Telephone Encounter (Signed)
Call to client and counseled regarding new diagnosis of syphilis and need for treatment ASAP. Client states can come tomorrow am and 1000 appt scheduled with arrival time of 0945. Harvie Heck RN, [facility] states will notify state DIS of new syphilis case. Jossie Ng, RN

## 2020-06-02 LAB — HEPATITIS B SURFACE ANTIGEN

## 2020-06-02 LAB — HM HEPATITIS C SCREENING LAB: HM Hepatitis Screen: NEGATIVE

## 2020-06-02 LAB — HM HIV SCREENING LAB: HM HIV Screening: NEGATIVE

## 2020-06-07 ENCOUNTER — Telehealth: Payer: Self-pay

## 2020-06-07 NOTE — Telephone Encounter (Signed)
Call transferred from Ike Bene, [facility] intake clerk and she states client desires to reschedule [facility] new OB appt again. After talking with Ms. Crady, she decided to keep new OB appt as scheduled. Client states she thought she had to be here earlier than 1245 for appt. Appt not rescheduled per client request. Jossie Ng, RN

## 2020-06-07 NOTE — Telephone Encounter (Signed)
Call from R. Marlan Palau MSW, OBCM who states client has ride in am and can be at agency for treatment between 0800 - 0830. Per Ms. Marlan Palau, RN also needs to sign BTL consent with client and reschedule missed new OB appt. All of above on pink sticky note. As BTL consent to be signed, MH appt used instead of Nurse Clinic appt. Jossie Ng, RN

## 2020-06-08 ENCOUNTER — Other Ambulatory Visit: Payer: Self-pay

## 2020-06-08 ENCOUNTER — Ambulatory Visit: Payer: Medicaid Other | Admitting: Advanced Practice Midwife

## 2020-06-08 DIAGNOSIS — O98119 Syphilis complicating pregnancy, unspecified trimester: Secondary | ICD-10-CM

## 2020-06-08 DIAGNOSIS — Z113 Encounter for screening for infections with a predominantly sexual mode of transmission: Secondary | ICD-10-CM

## 2020-06-08 MED ORDER — BICILLIN L-A 2400000 UNIT/4ML IM SUSP: 2.4000 10*6.[IU] | Freq: Once | INTRAMUSCULAR | 0 refills | Status: DC

## 2020-06-08 MED ORDER — PENICILLIN G BENZATHINE 1200000 UNIT/2ML IM SUSP
2.4000 10*6.[IU] | Freq: Once | INTRAMUSCULAR | Status: AC
  Administered 2020-06-08: 2.4 10*6.[IU] via INTRAMUSCULAR

## 2020-06-08 NOTE — Progress Notes (Addendum)
Here today for treatment of Syphilis. Bicillin injections x 2 given and tolerated well. BTL consent form signed as well as ROI to and from [facility] regarding 06/15/2020 appt to [facility]. Instructed patient of importance to keep above scheduled appt. Tawny Hopping, RN

## 2020-06-08 NOTE — Addendum Note (Signed)
Addended by: Geanie Berlin on: 06/08/2020 04:08 PM   Modules accepted: Orders

## 2021-04-28 IMAGING — US US OB COMP +14 WK
1 series · 13 of 28 positions shown · non-contrast
Comparison: none

CLINICAL DATA: Late to prenatal care. Evaluate dating and fetal
anatomy survey.

EXAM:
OBSTETRICAL ULTRASOUND >14 WKS

[Series 1: us ob comp + 14 wk · 13 of 90 slices shown]
[im 4/90]
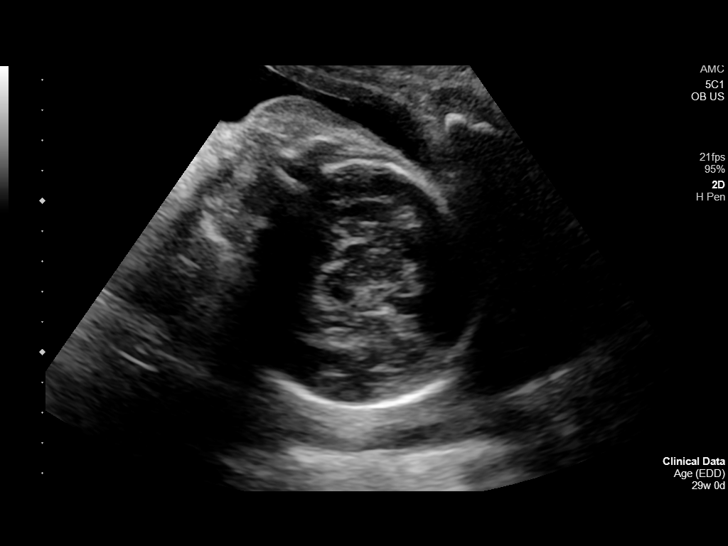
[im 10/90]
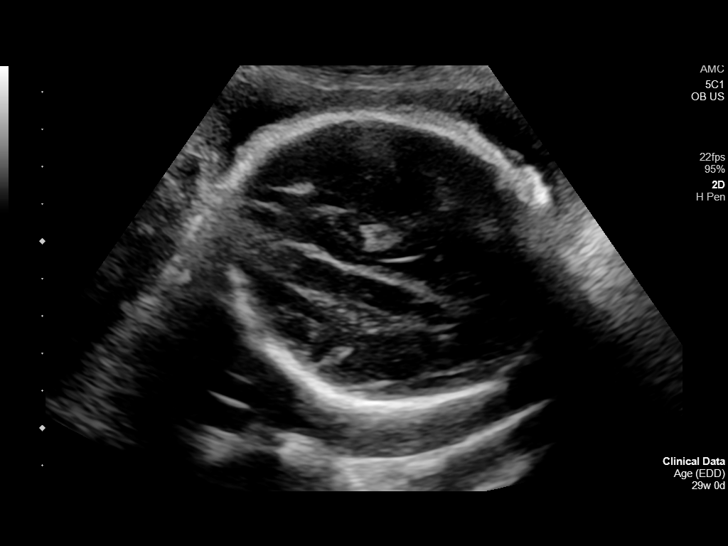
[im 17/90]
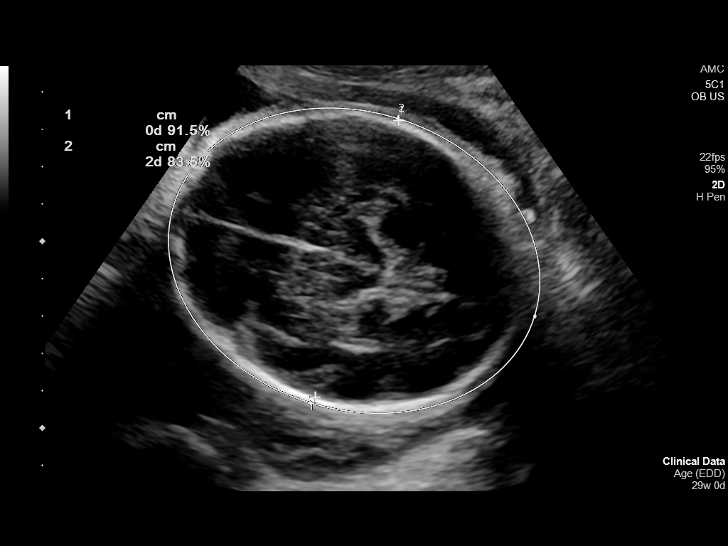
[im 24/90]
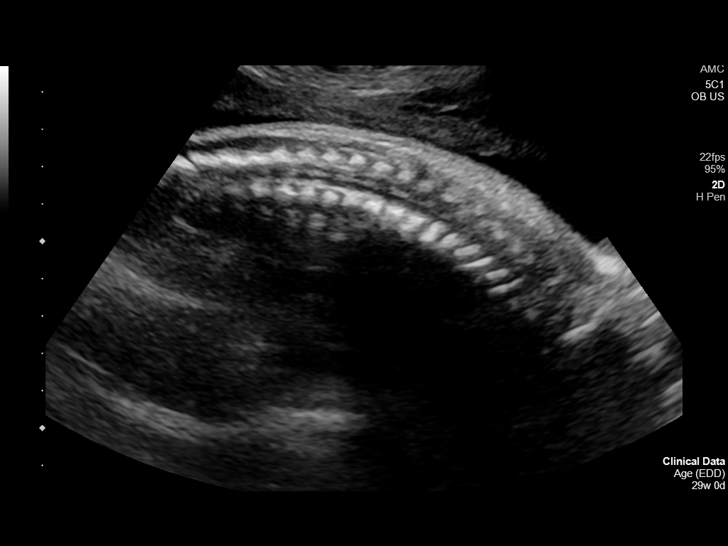
[im 30/90]
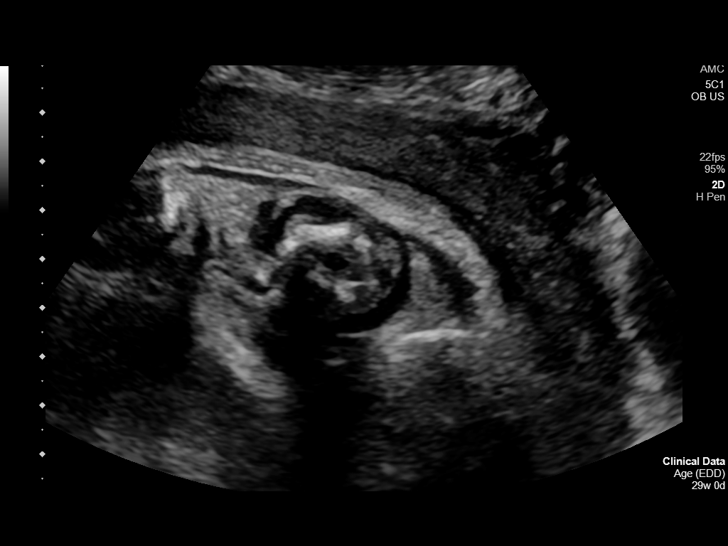
[im 37/90]
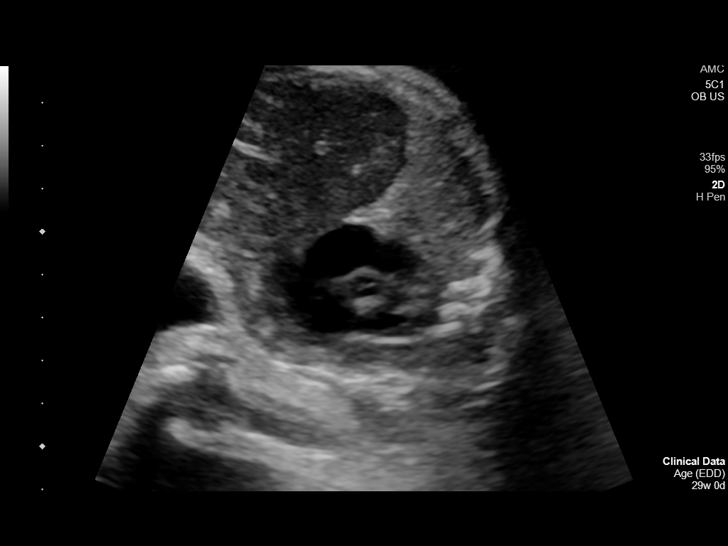
[im 47/90]
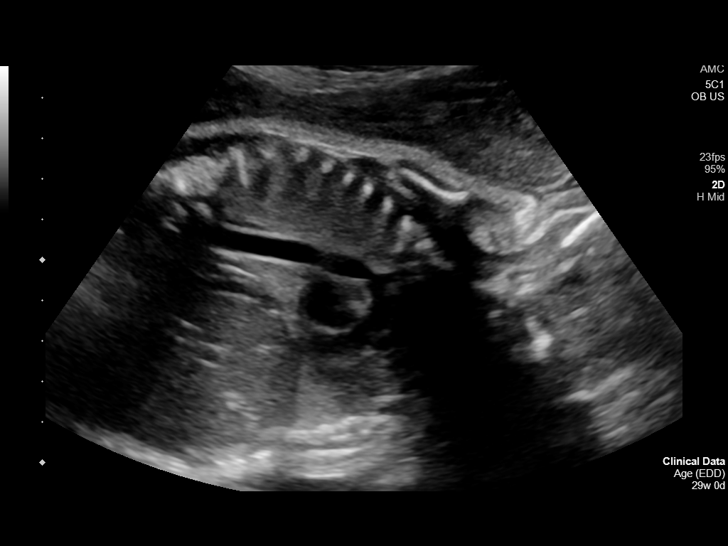
[im 53/90]
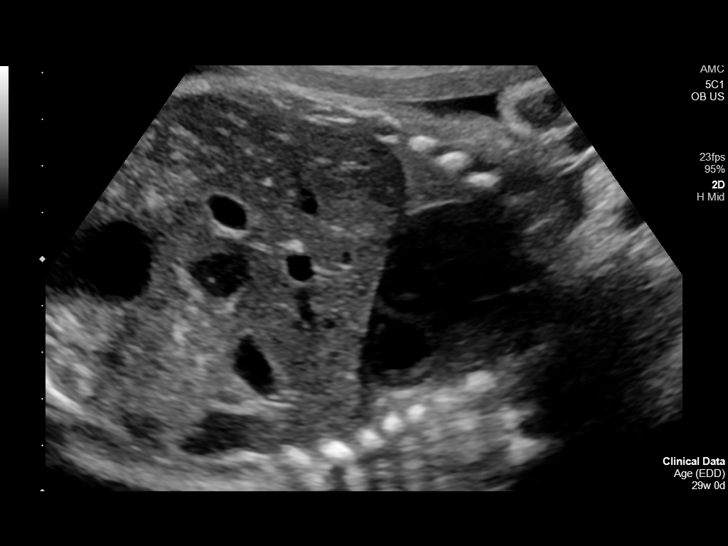
[im 60/90]
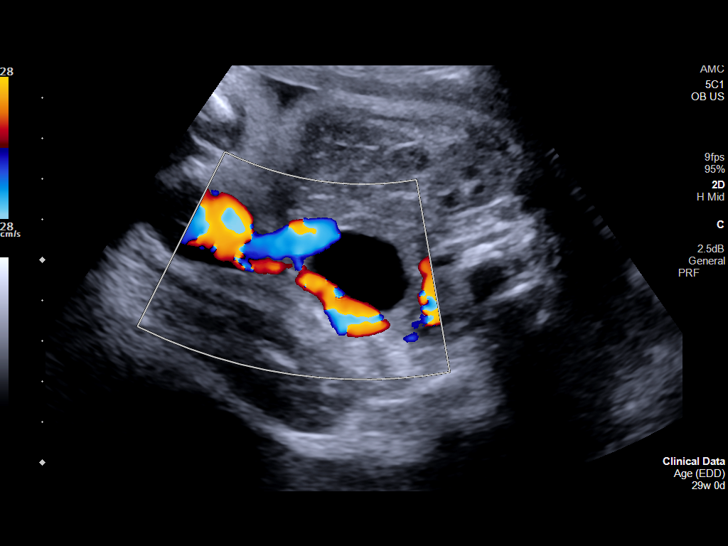
[im 66/90]
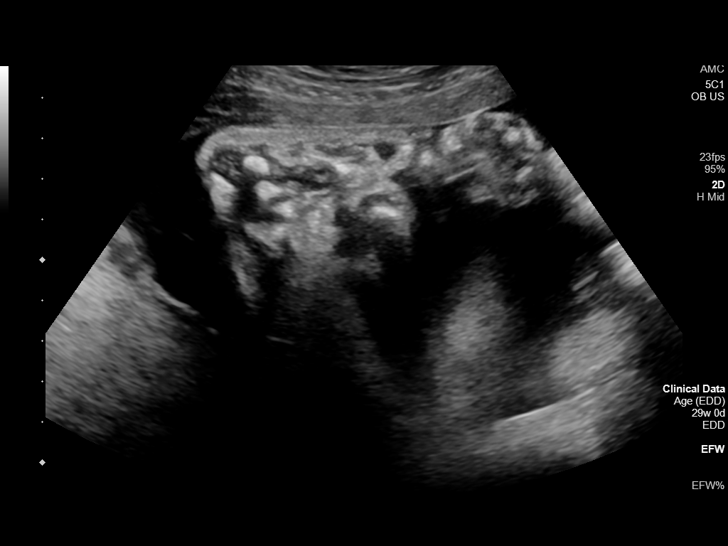
[im 73/90]
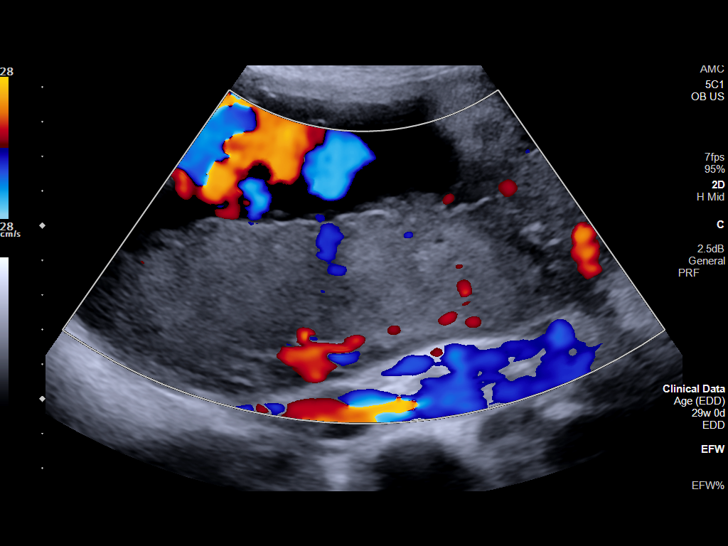
[im 80/90]
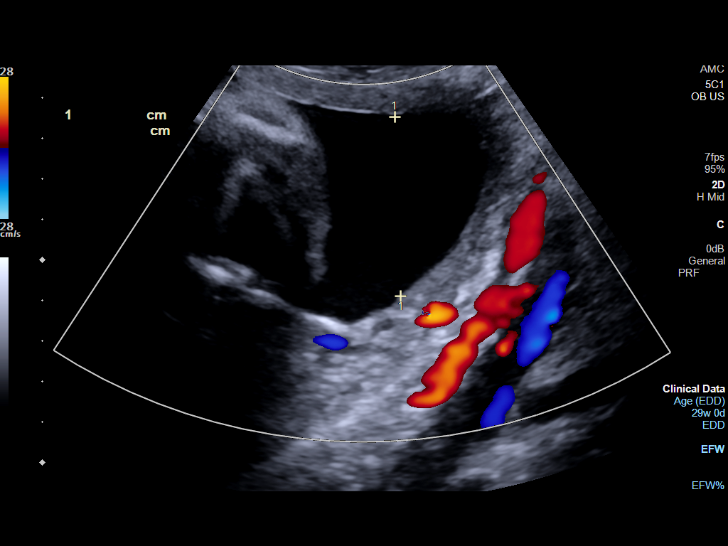
[im 86/90]
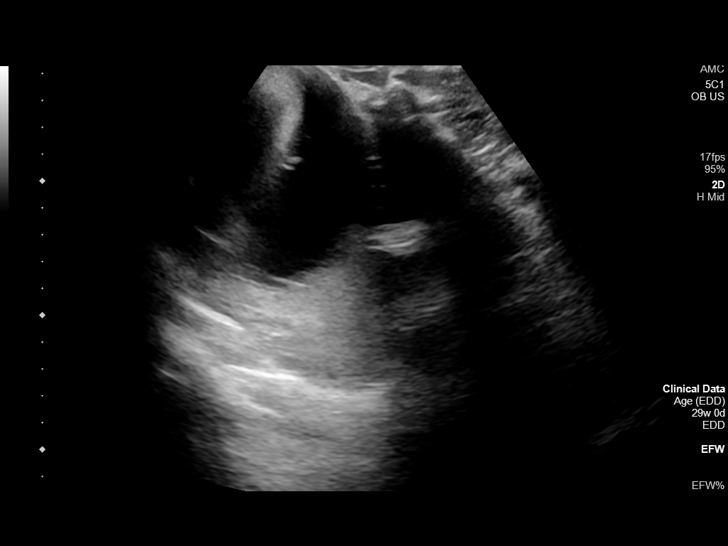

[13 of 28 positions shown; findings below may reference images not displayed]

FINDINGS: Number of Fetuses: 1

Heart Rate:  137 bpm

Movement: Yes

Presentation: Cephalic

Previa: No

Placental Location: Posterior

Amniotic Fluid (Subjective): Within normal limits

Amniotic Fluid (Objective):

Vertical pocket = 5.5cm

AFI = 16.2 cm (5%ile= 8.6 cm, 95%= 24.2 cm for 32 wks)

FETAL BIOMETRY

BPD: 7.8cm 31w 1d

HC:   28.6cm 31w 3d

AC:   27.6cm 31w 5d

FL:   6.1cm 31w 4d

Current Mean GA: 31w 5d US EDC: 11/14/2019

Estimated Fetal Weight:  1,796g

FETAL ANATOMY

Lateral Ventricles: Appears normal

Thalami/CSP: Appears normal

Posterior Fossa:  Appears normal

Nuchal Region: Appears normal   NFT= N/A > 20 WKS

Upper Lip: Appears normal

Spine: Appears normal

4 Chamber Heart on Left: Appears normal

LVOT: Appears normal

RVOT: Appears normal

Stomach on Left: Appears normal

3 Vessel Cord: Appears normal

Cord Insertion site: Appears normal

Kidneys: Appears normal

Bladder: Appears normal

Extremities: Appears normal

Technically difficult due to: Advanced gestational age

Maternal Findings:

Cervix:  3.3 cm TA
IMPRESSION: Single living IUP with estimated gestational age of 31 weeks 5 day,
and US EDC of 11/14/2019.

Unremarkable anatomic survey.  No fetal anomalies identified.

## 2021-10-05 ENCOUNTER — Other Ambulatory Visit: Payer: Self-pay

## 2021-10-05 NOTE — Progress Notes (Signed)
Pt cleared pre-employment UDS, HR notified./CL,RMA
